# Patient Record
Sex: Male | Born: 1970 | Race: White | Hispanic: No | Marital: Married | Smoking: Current every day smoker
Health system: Southern US, Community
[De-identification: ages and names within clinical notes are randomized; demographics above are authoritative.]

## PROBLEM LIST (undated history)

## (undated) DIAGNOSIS — K7460 Unspecified cirrhosis of liver: Secondary | ICD-10-CM

## (undated) DIAGNOSIS — F4310 Post-traumatic stress disorder, unspecified: Secondary | ICD-10-CM

## (undated) DIAGNOSIS — F329 Major depressive disorder, single episode, unspecified: Secondary | ICD-10-CM

## (undated) DIAGNOSIS — F419 Anxiety disorder, unspecified: Secondary | ICD-10-CM

## (undated) DIAGNOSIS — F32A Depression, unspecified: Secondary | ICD-10-CM

## (undated) HISTORY — PX: KNEE SURGERY: SHX244

## (undated) HISTORY — DX: Unspecified cirrhosis of liver: K74.60

## (undated) HISTORY — DX: Post-traumatic stress disorder, unspecified: F43.10

---

## 1998-04-11 ENCOUNTER — Encounter: Payer: Self-pay | Admitting: Family Medicine

## 1998-04-11 ENCOUNTER — Ambulatory Visit (HOSPITAL_COMMUNITY): Admission: RE | Admit: 1998-04-11 | Discharge: 1998-04-11 | Payer: Self-pay | Admitting: Family Medicine

## 1998-04-11 ENCOUNTER — Emergency Department (HOSPITAL_COMMUNITY): Admission: EM | Admit: 1998-04-11 | Discharge: 1998-04-11 | Payer: Self-pay | Admitting: Emergency Medicine

## 2001-11-11 ENCOUNTER — Emergency Department (HOSPITAL_COMMUNITY): Admission: EM | Admit: 2001-11-11 | Discharge: 2001-11-11 | Payer: Self-pay | Admitting: Emergency Medicine

## 2001-11-11 ENCOUNTER — Encounter: Payer: Self-pay | Admitting: Emergency Medicine

## 2003-03-29 ENCOUNTER — Emergency Department (HOSPITAL_COMMUNITY): Admission: AD | Admit: 2003-03-29 | Discharge: 2003-03-29 | Payer: Self-pay | Admitting: Family Medicine

## 2006-05-27 ENCOUNTER — Encounter: Admission: RE | Admit: 2006-05-27 | Discharge: 2006-05-27 | Payer: Self-pay | Admitting: Occupational Medicine

## 2012-01-23 ENCOUNTER — Emergency Department (HOSPITAL_COMMUNITY): Payer: 59

## 2012-01-23 ENCOUNTER — Encounter: Payer: Self-pay | Admitting: Emergency Medicine

## 2012-01-23 ENCOUNTER — Ambulatory Visit: Payer: 59

## 2012-01-23 ENCOUNTER — Encounter (HOSPITAL_COMMUNITY): Payer: Self-pay | Admitting: *Deleted

## 2012-01-23 ENCOUNTER — Ambulatory Visit (INDEPENDENT_AMBULATORY_CARE_PROVIDER_SITE_OTHER): Payer: 59 | Admitting: Emergency Medicine

## 2012-01-23 ENCOUNTER — Emergency Department (HOSPITAL_COMMUNITY)
Admission: EM | Admit: 2012-01-23 | Discharge: 2012-01-23 | Disposition: A | Discharge status: Home / Self Care | Payer: 59 | Attending: Emergency Medicine | Admitting: Emergency Medicine

## 2012-01-23 VITALS — BP 116/77 | HR 89 | Temp 97.6°F | Resp 20 | Ht 74.0 in | Wt 177.0 lb

## 2012-01-23 DIAGNOSIS — R079 Chest pain, unspecified: Secondary | ICD-10-CM | POA: Insufficient documentation

## 2012-01-23 DIAGNOSIS — R05 Cough: Secondary | ICD-10-CM | POA: Insufficient documentation

## 2012-01-23 DIAGNOSIS — F17200 Nicotine dependence, unspecified, uncomplicated: Secondary | ICD-10-CM | POA: Insufficient documentation

## 2012-01-23 DIAGNOSIS — R9389 Abnormal findings on diagnostic imaging of other specified body structures: Secondary | ICD-10-CM

## 2012-01-23 DIAGNOSIS — R0602 Shortness of breath: Secondary | ICD-10-CM

## 2012-01-23 DIAGNOSIS — R059 Cough, unspecified: Secondary | ICD-10-CM | POA: Insufficient documentation

## 2012-01-23 HISTORY — DX: Depression, unspecified: F32.A

## 2012-01-23 HISTORY — DX: Major depressive disorder, single episode, unspecified: F32.9

## 2012-01-23 HISTORY — DX: Rider (driver) (passenger) of other motorcycle injured in unspecified traffic accident, initial encounter: V29.99XA

## 2012-01-23 LAB — POCT I-STAT TROPONIN I

## 2012-01-23 LAB — CBC WITH DIFFERENTIAL/PLATELET
Basophils Relative: 0 % (ref 0–1)
Eosinophils Relative: 2 % (ref 0–5)
HCT: 40.4 % (ref 39.0–52.0)
Hemoglobin: 14 g/dL (ref 13.0–17.0)
MCH: 31.5 pg (ref 26.0–34.0)
MCHC: 34.7 g/dL (ref 30.0–36.0)
MCV: 90.8 fL (ref 78.0–100.0)
Monocytes Absolute: 0.7 10*3/uL (ref 0.1–1.0)
Monocytes Relative: 8 % (ref 3–12)
Neutro Abs: 6.6 10*3/uL (ref 1.7–7.7)

## 2012-01-23 LAB — COMPREHENSIVE METABOLIC PANEL
Albumin: 3.6 g/dL (ref 3.5–5.2)
BUN: 11 mg/dL (ref 6–23)
Chloride: 104 mEq/L (ref 96–112)
Creatinine, Ser: 0.85 mg/dL (ref 0.50–1.35)
GFR calc non Af Amer: >90 mL/min (ref >90)
Total Bilirubin: 0.3 mg/dL (ref 0.3–1.2)

## 2012-01-23 LAB — LIPASE, BLOOD: Lipase: 28 U/L (ref 11–59)

## 2012-01-23 MED ORDER — TRAMADOL HCL 50 MG PO TABS: 50.0000 mg | ORAL_TABLET | Freq: Four times a day (QID) | ORAL | Status: DC | PRN

## 2012-01-23 MED ORDER — MORPHINE SULFATE 4 MG/ML IJ SOLN
4.0000 mg | Freq: Once | INTRAMUSCULAR | Status: AC
  Administered 2012-01-23: 4 mg via INTRAVENOUS
  Filled 2012-01-23: qty 1

## 2012-01-23 MED ORDER — ACETAMINOPHEN 325 MG PO TABS
650.0000 mg | ORAL_TABLET | Freq: Once | ORAL | Status: AC
  Administered 2012-01-23: 650 mg via ORAL
  Filled 2012-01-23: qty 1
  Filled 2012-01-23: qty 2

## 2012-01-23 MED ORDER — ONDANSETRON HCL 4 MG/2ML IJ SOLN
4.0000 mg | Freq: Once | INTRAMUSCULAR | Status: AC
  Administered 2012-01-23: 4 mg via INTRAVENOUS
  Filled 2012-01-23: qty 2

## 2012-01-23 NOTE — ED Provider Notes (Signed)
9:38 AM  Date: 01/23/2012  Rate: 62  Rhythm: normal sinus rhythm  QRS Axis: normal  Intervals: normal  ST/T Wave abnormalities: normal  Conduction Disutrbances:none  Narrative Interpretation: Normal EKG  Old EKG Reviewed: none available    Carleene Cooper III, MD 01/23/12 [phone]

## 2012-01-23 NOTE — Progress Notes (Signed)
  Subjective:    Patient ID: Lance Matthews, male    DOB: [birth year], 41 y.o.   MRN: [id number]  HPI issue managements onset Saturday of severe lower substernal chest pain. The pain is sharp in nature. It seems to come and go and is severe at times. He has significant pain when he tries to take a breath in. He denies previous trauma to his chest. The pain has come and gone since Saturday. He awakened this morning with severe pain associated with significant shortness of breath. He denies radiation of the pain. He has had no nausea or diaphoresis. He has no history of coronary disease. He is a history of the smoking no drug use..    Review of Systems significant for history of depression currently under treatment     Objective:   Physical Exam HEENT exam is unremarkable. His neck is supple. His chest is clear to auscultation and percussion. Cardiac exam did not reveal a rub on exam. Heart sounds are normal  [facility] reading (PRIMARY) by  Dr.Derrien Anschutz prominent markings in both lung bases and in the lingula. Heart size is small  EKG      Assessment     Patient presents with severe pleuritic chest pain with abn CXR with increased markings.

## 2012-01-23 NOTE — ED Notes (Signed)
Patient transported to X-ray 
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
## 2012-01-23 NOTE — ED Provider Notes (Signed)
Medical screening examination/treatment/procedure(s) were performed by non-physician practitioner and as supervising physician I was immediately available for consultation/collaboration.   Carleene Cooper III, MD 01/23/12 2028

## 2012-01-23 NOTE — ED Notes (Signed)
Pt has had epigastric pain which is sharp in nature and increases with deep inspiration since Saturday.  Pt was seen at [facility], EKG was WNL and pt sent to [facility] for further evaluation.  No n/v or diaphoresis with this.  No sob.  Pt has IV in LAC, pt had 324mg  aspirin and 3sl nitro pta.  Pain was 5/10 at its worst and is 3/10 on arrival.  Pt is alert and oriented on arrival.

## 2014-02-11 ENCOUNTER — Emergency Department (HOSPITAL_COMMUNITY): Payer: 59

## 2014-02-11 ENCOUNTER — Emergency Department (HOSPITAL_COMMUNITY)
Admission: EM | Admit: 2014-02-11 | Discharge: 2014-02-11 | Disposition: A | Discharge status: Home / Self Care | Payer: 59 | Attending: Emergency Medicine | Admitting: Emergency Medicine

## 2014-02-11 ENCOUNTER — Encounter (HOSPITAL_COMMUNITY): Payer: Self-pay | Admitting: Emergency Medicine

## 2014-02-11 DIAGNOSIS — R109 Unspecified abdominal pain: Secondary | ICD-10-CM | POA: Diagnosis present

## 2014-02-11 DIAGNOSIS — F329 Major depressive disorder, single episode, unspecified: Secondary | ICD-10-CM | POA: Insufficient documentation

## 2014-02-11 DIAGNOSIS — K5732 Diverticulitis of large intestine without perforation or abscess without bleeding: Secondary | ICD-10-CM

## 2014-02-11 DIAGNOSIS — Z792 Long term (current) use of antibiotics: Secondary | ICD-10-CM | POA: Diagnosis not present

## 2014-02-11 DIAGNOSIS — Z87828 Personal history of other (healed) physical injury and trauma: Secondary | ICD-10-CM | POA: Insufficient documentation

## 2014-02-11 DIAGNOSIS — Z79899 Other long term (current) drug therapy: Secondary | ICD-10-CM | POA: Insufficient documentation

## 2014-02-11 DIAGNOSIS — Z720 Tobacco use: Secondary | ICD-10-CM | POA: Diagnosis not present

## 2014-02-11 DIAGNOSIS — R1030 Lower abdominal pain, unspecified: Secondary | ICD-10-CM

## 2014-02-11 LAB — COMPREHENSIVE METABOLIC PANEL
ALBUMIN: 4 g/dL (ref 3.5–5.2)
ALK PHOS: 87 U/L (ref 39–117)
ALT: 17 U/L (ref 0–53)
ANION GAP: 12 (ref 5–15)
AST: 14 U/L (ref 0–37)
BUN: 12 mg/dL (ref 6–23)
CALCIUM: 9.1 mg/dL (ref 8.4–10.5)
CO2: 27 mEq/L (ref 19–32)
CREATININE: 0.96 mg/dL (ref 0.50–1.35)
Chloride: 98 mEq/L (ref 96–112)
GFR calc non Af Amer: >90 mL/min (ref >90)
GLUCOSE: 97 mg/dL (ref 70–99)
Potassium: 3.7 mEq/L (ref 3.7–5.3)
Sodium: 137 mEq/L (ref 137–147)
TOTAL PROTEIN: 7.1 g/dL (ref 6.0–8.3)
Total Bilirubin: 0.6 mg/dL (ref 0.3–1.2)

## 2014-02-11 LAB — CBC WITH DIFFERENTIAL/PLATELET
BASOS PCT: 0 % (ref 0–1)
Basophils Absolute: 0 10*3/uL (ref 0.0–0.1)
EOS ABS: 0.1 10*3/uL (ref 0.0–0.7)
EOS PCT: 0 % (ref 0–5)
HCT: 43.3 % (ref 39.0–52.0)
HEMOGLOBIN: 15.2 g/dL (ref 13.0–17.0)
LYMPHS ABS: 1.2 10*3/uL (ref 0.7–4.0)
Lymphocytes Relative: 8 % — ABNORMAL LOW (ref 12–46)
MCH: 31.9 pg (ref 26.0–34.0)
MCHC: 35.1 g/dL (ref 30.0–36.0)
MCV: 90.8 fL (ref 78.0–100.0)
MONO ABS: 0.9 10*3/uL (ref 0.1–1.0)
MONOS PCT: 6 % (ref 3–12)
NEUTROS PCT: 86 % — AB (ref 43–77)
Neutro Abs: 12.2 10*3/uL — ABNORMAL HIGH (ref 1.7–7.7)
Platelets: 156 10*3/uL (ref 150–400)
RBC: 4.77 MIL/uL (ref 4.22–5.81)
RDW: 13.1 % (ref 11.5–15.5)
WBC: 14.3 10*3/uL — ABNORMAL HIGH (ref 4.0–10.5)

## 2014-02-11 LAB — URINALYSIS, ROUTINE W REFLEX MICROSCOPIC
BILIRUBIN URINE: NEGATIVE
Glucose, UA: NEGATIVE mg/dL
Hgb urine dipstick: NEGATIVE
Ketones, ur: NEGATIVE mg/dL
LEUKOCYTES UA: NEGATIVE
NITRITE: NEGATIVE
PH: 7 (ref 5.0–8.0)
Protein, ur: NEGATIVE mg/dL
SPECIFIC GRAVITY, URINE: 1.014 (ref 1.005–1.030)
UROBILINOGEN UA: 1 mg/dL (ref 0.0–1.0)

## 2014-02-11 MED ORDER — METRONIDAZOLE 500 MG PO TABS: 500.0000 mg | ORAL_TABLET | Freq: Three times a day (TID) | ORAL | Status: DC

## 2014-02-11 MED ORDER — IOHEXOL 300 MG/ML  SOLN
100.0000 mL | Freq: Once | INTRAMUSCULAR | Status: AC | PRN
  Administered 2014-02-11: 100 mL via INTRAVENOUS

## 2014-02-11 MED ORDER — OXYCODONE-ACETAMINOPHEN 5-325 MG PO TABS: 1.0000 | ORAL_TABLET | ORAL | Status: DC | PRN

## 2014-02-11 MED ORDER — IOHEXOL 300 MG/ML  SOLN
50.0000 mL | Freq: Once | INTRAMUSCULAR | Status: AC | PRN
  Administered 2014-02-11: 50 mL via ORAL

## 2014-02-11 MED ORDER — METRONIDAZOLE IN NACL 5-0.79 MG/ML-% IV SOLN
500.0000 mg | Freq: Once | INTRAVENOUS | Status: AC
  Administered 2014-02-11: 500 mg via INTRAVENOUS
  Filled 2014-02-11: qty 100

## 2014-02-11 MED ORDER — CIPROFLOXACIN HCL 500 MG PO TABS: 500.0000 mg | ORAL_TABLET | Freq: Two times a day (BID) | ORAL | Status: DC

## 2014-02-11 MED ORDER — CIPROFLOXACIN IN D5W 400 MG/200ML IV SOLN
400.0000 mg | Freq: Once | INTRAVENOUS | Status: AC
  Administered 2014-02-11: 400 mg via INTRAVENOUS
  Filled 2014-02-11: qty 200

## 2014-02-11 MED ORDER — SODIUM CHLORIDE 0.9 % IV SOLN
Freq: Once | INTRAVENOUS | Status: AC
  Administered 2014-02-11: 16:00:00 via INTRAVENOUS

## 2014-02-11 MED ORDER — ONDANSETRON HCL 4 MG/2ML IJ SOLN
4.0000 mg | Freq: Once | INTRAMUSCULAR | Status: AC
  Administered 2014-02-11: 4 mg via INTRAVENOUS
  Filled 2014-02-11: qty 2

## 2014-02-11 MED ORDER — HYDROMORPHONE HCL 1 MG/ML IJ SOLN
0.5000 mg | Freq: Once | INTRAMUSCULAR | Status: AC
  Administered 2014-02-11: 0.5 mg via INTRAVENOUS
  Filled 2014-02-11: qty 1

## 2014-02-11 MED ORDER — ONDANSETRON 8 MG PO TBDP: ORAL_TABLET | ORAL | Status: DC

## 2014-02-11 MED ORDER — HYDROMORPHONE HCL 1 MG/ML IJ SOLN
1.0000 mg | INTRAMUSCULAR | Status: DC | PRN
  Administered 2014-02-11: 1 mg via INTRAVENOUS
  Filled 2014-02-11: qty 1

## 2014-02-11 MED ORDER — OXYCODONE-ACETAMINOPHEN 5-325 MG PO TABS
2.0000 | ORAL_TABLET | Freq: Once | ORAL | Status: AC
  Administered 2014-02-11: 2 via ORAL
  Filled 2014-02-11: qty 2

## 2014-02-11 NOTE — ED Provider Notes (Signed)
Medical screening examination/treatment/procedure(s) were performed by non-physician practitioner and as supervising physician I was immediately available for consultation/collaboration.   EKG Interpretation None        Ernestina Patches, MD 02/11/14 2112
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
## 2014-02-11 NOTE — ED Notes (Signed)
Stomach pain x 1 day. EDPA Jarrett Soho present to evaluate pt upon arrival to room

## 2014-02-11 NOTE — ED Notes (Signed)
Patient transported to CT 

## 2014-02-11 NOTE — Discharge Instructions (Signed)
1. Medications: Cipro, Flagyl, Percocet, Zofran, usual home medications 2. Treatment: rest, drink plenty of fluids, advance diet slowly 3. Follow Up: Please followup with your primary doctor in 2 days for discussion of your diagnoses and further evaluation after today's visit; if you do not have a primary care doctor use the resource guide provided to find one; Please return to the ER for persistent vomiting, high fevers or worsening symptoms     Diverticulitis Diverticulitis is inflammation or infection of small pouches in your colon that form when you have a condition called diverticulosis. The pouches in your colon are called diverticula. Your colon, or large intestine, is where water is absorbed and stool is formed. Complications of diverticulitis can include:  Bleeding.  Severe infection.  Severe pain.  Perforation of your colon.  Obstruction of your colon. CAUSES  Diverticulitis is caused by bacteria. Diverticulitis happens when stool becomes trapped in diverticula. This allows bacteria to grow in the diverticula, which can lead to inflammation and infection. RISK FACTORS People with diverticulosis are at risk for diverticulitis. Eating a diet that does not include enough fiber from fruits and vegetables may make diverticulitis more likely to develop. SYMPTOMS  Symptoms of diverticulitis may include:  Abdominal pain and tenderness. The pain is normally located on the left side of the abdomen, but may occur in other areas.  Fever and chills.  Bloating.  Cramping.  Nausea.  Vomiting.  Constipation.  Diarrhea.  Blood in your stool. DIAGNOSIS  Your health care provider will ask you about your medical history and do a physical exam. You may need to have tests done because many medical conditions can cause the same symptoms as diverticulitis. Tests may include:  Blood tests.  Urine tests.  Imaging tests of the abdomen, including X-rays and CT scans. When your  condition is under control, your health care provider may recommend that you have a colonoscopy. A colonoscopy can show how severe your diverticula are and whether something else is causing your symptoms. TREATMENT  Most cases of diverticulitis are mild and can be treated at home. Treatment may include:  Taking over-the-counter pain medicines.  Following a clear liquid diet.  Taking antibiotic medicines by mouth for 7-10 days. More severe cases may be treated at a hospital. Treatment may include:  Not eating or drinking.  Taking prescription pain medicine.  Receiving antibiotic medicines through an IV tube.  Receiving fluids and nutrition through an IV tube.  Surgery. HOME CARE INSTRUCTIONS   Follow your health care provider's instructions carefully.  Follow a full liquid diet or other diet as directed by your health care provider. After your symptoms improve, your health care provider may tell you to change your diet. He or she may recommend you eat a high-fiber diet. Fruits and vegetables are good sources of fiber. Fiber makes it easier to pass stool.  Take fiber supplements or probiotics as directed by your health care provider.  Only take medicines as directed by your health care provider.  Keep all your follow-up appointments. SEEK MEDICAL CARE IF:   Your pain does not improve.  You have a hard time eating food.  Your bowel movements do not return to normal. SEEK IMMEDIATE MEDICAL CARE IF:   Your pain becomes worse.  Your symptoms do not get better.  Your symptoms suddenly get worse.  You have a fever.  You have repeated vomiting.  You have bloody or black, tarry stools. MAKE SURE YOU:   Understand these instructions.  Will  watch your condition.  Will get help right away if you are not doing well or get worse. Document Released: 01/09/2005 Document Revised: 04/06/2013 Document Reviewed: 02/24/2013 [facility] Patient Information 2015 [place], [place]. This  information is not intended to replace advice given to you by your health care provider. Make sure you discuss any questions you have with your health care provider.

## 2015-04-05 ENCOUNTER — Ambulatory Visit (INDEPENDENT_AMBULATORY_CARE_PROVIDER_SITE_OTHER): Payer: 59 | Admitting: Family Medicine

## 2015-04-05 VITALS — BP 126/80 | HR 71 | Temp 97.5°F | Resp 18 | Ht 73.0 in | Wt 180.0 lb

## 2015-04-05 DIAGNOSIS — S39012A Strain of muscle, fascia and tendon of lower back, initial encounter: Secondary | ICD-10-CM

## 2015-04-05 MED ORDER — OXYCODONE-ACETAMINOPHEN 5-325 MG PO TABS: 1.0000 | ORAL_TABLET | Freq: Three times a day (TID) | ORAL | Status: DC | PRN

## 2015-04-05 MED ORDER — PREDNISONE 20 MG PO TABS: ORAL_TABLET | ORAL | Status: DC

## 2015-04-05 MED ORDER — CYCLOBENZAPRINE HCL 10 MG PO TABS: 10.0000 mg | ORAL_TABLET | Freq: Three times a day (TID) | ORAL | Status: DC | PRN

## 2015-04-05 NOTE — Patient Instructions (Signed)
AFTER YOU COMPLETE THE PREDNISONE, I recommend keeping ibuprofen on board - esp taking before and during work. Then when you come home, take a muscle relaxant followed by 15 minutes of heat followed by gentle stretching. Try to do this heat/strethcing regiment 2 - 3 times a day.  If you are still having pain in 4 to 6 wks, come back to clinic for further eval. RTC immed if symptoms worsen or you develop any other concerning symptoms such as increasing pain, pain traveling somewhere, numbness or weakness in the leg, changes in bowels/bladder, or fever/chills so we can repeat exam and determine if imaging or other treatment are normal.  Low Back Strain With Rehab A strain is an injury in which a tendon or muscle is torn. The muscles and tendons of the lower back are vulnerable to strains. However, these muscles and tendons are very strong and require a great force to be injured. Strains are classified into three categories. Grade 1 strains cause pain, but the tendon is not lengthened. Grade 2 strains include a lengthened ligament, due to the ligament being stretched or partially ruptured. With grade 2 strains there is still function, although the function may be decreased. Grade 3 strains involve a complete tear of the tendon or muscle, and function is usually impaired. SYMPTOMS   Pain in the lower back.  Pain that affects one side more than the other.  Pain that gets worse with movement and may be felt in the hip, buttocks, or back of the thigh.  Muscle spasms of the muscles in the back.  Swelling along the muscles of the back.  Loss of strength of the back muscles.  Crackling sound (crepitation) when the muscles are touched. CAUSES  Lower back strains occur when a force is placed on the muscles or tendons that is greater than they can handle. Common causes of injury include:  Prolonged overuse of the muscle-tendon units in the lower back, usually from incorrect posture.  A single violent  injury or force applied to the back. RISK INCREASES WITH:  Sports that involve twisting forces on the spine or a lot of bending at the waist (football, rugby, weightlifting, bowling, golf, tennis, speed skating, racquetball, swimming, running, gymnastics, diving).  Poor strength and flexibility.  Failure to warm up properly before activity.  Family history of lower back pain or disk disorders.  Previous back injury or surgery (especially fusion).  Poor posture with lifting, especially heavy objects.  Prolonged sitting, especially with poor posture. PREVENTION   Learn and use proper posture when sitting or lifting (maintain proper posture when sitting, lift using the knees and legs, not at the waist).  Warm up and stretch properly before activity.  Allow for adequate recovery between workouts.  Maintain physical fitness:  Strength, flexibility, and endurance.  Cardiovascular fitness. PROGNOSIS  If treated properly, lower back strains usually heal within 6 weeks. RELATED COMPLICATIONS   Recurring symptoms, resulting in a chronic problem.  Chronic inflammation, scarring, and partial muscle-tendon tear.  Delayed healing or resolution of symptoms.  Prolonged disability. TREATMENT  Treatment first involves the use of ice and medicine, to reduce pain and inflammation. The use of strengthening and stretching exercises may help reduce pain with activity. These exercises may be performed at home or with a therapist. Severe injuries may require referral to a therapist for further evaluation and treatment, such as ultrasound. Your caregiver may advise that you wear a back brace or corset, to help reduce pain and discomfort. Often,  prolonged bed rest results in greater harm then benefit. Corticosteroid injections may be recommended. However, these should be reserved for the most serious cases. It is important to avoid using your back when lifting objects. At night, sleep on your back on a  firm mattress with a pillow placed under your knees. If non-surgical treatment is unsuccessful, surgery may be needed.  MEDICATION   If pain medicine is needed, nonsteroidal anti-inflammatory medicines (aspirin and ibuprofen), or other minor pain relievers (acetaminophen), are often advised.  Do not take pain medicine for 7 days before surgery.  Prescription pain relievers may be given, if your caregiver thinks they are needed. Use only as directed and only as much as you need.  Ointments applied to the skin may be helpful.  Corticosteroid injections may be given by your caregiver. These injections should be reserved for the most serious cases, because they may only be given a certain number of times. HEAT AND COLD  Cold treatment (icing) should be applied for 10 to 15 minutes every 2 to 3 hours for inflammation and pain, and immediately after activity that aggravates your symptoms. Use ice packs or an ice massage.  Heat treatment may be used before performing stretching and strengthening activities prescribed by your caregiver, physical therapist, or athletic trainer. Use a heat pack or a warm water soak. SEEK MEDICAL CARE IF:   Symptoms get worse or do not improve in 2 to 4 weeks, despite treatment.  You develop numbness, weakness, or loss of bowel or bladder function.  New, unexplained symptoms develop. (Drugs used in treatment may produce side effects.) EXERCISES  RANGE OF MOTION (ROM) AND STRETCHING EXERCISES - Low Back Strain Most people with lower back pain will find that their symptoms get worse with excessive bending forward (flexion) or arching at the lower back (extension). The exercises which will help resolve your symptoms will focus on the opposite motion.  Your physician, physical therapist or athletic trainer will help you determine which exercises will be most helpful to resolve your lower back pain. Do not complete any exercises without first consulting with your  caregiver. Discontinue any exercises which make your symptoms worse until you speak to your caregiver.  If you have pain, numbness or tingling which travels down into your buttocks, leg or foot, the goal of the therapy is for these symptoms to move closer to your back and eventually resolve. Sometimes, these leg symptoms will get better, but your lower back pain may worsen. This is typically an indication of progress in your rehabilitation. Be very alert to any changes in your symptoms and the activities in which you participated in the 24 hours prior to the change. Sharing this information with your caregiver will allow him/her to most efficiently treat your condition.  These exercises may help you when beginning to rehabilitate your injury. Your symptoms may resolve with or without further involvement from your physician, physical therapist or athletic trainer. While completing these exercises, remember:  Restoring tissue flexibility helps normal motion to return to the joints. This allows healthier, less painful movement and activity.  An effective stretch should be held for at least 30 seconds.  A stretch should never be painful. You should only feel a gentle lengthening or release in the stretched tissue. FLEXION RANGE OF MOTION AND STRETCHING EXERCISES: STRETCH - Flexion, Single Knee to Chest   Lie on a firm bed or floor with both legs extended in front of you.  Keeping one leg in contact with the  floor, bring your opposite knee to your chest. Hold your leg in place by either grabbing behind your thigh or at your knee.  Pull until you feel a gentle stretch in your lower back. Hold __________ seconds.  Slowly release your grasp and repeat the exercise with the opposite side. Repeat __________ times. Complete this exercise __________ times per day.  STRETCH - Flexion, Double Knee to Chest   Lie on a firm bed or floor with both legs extended in front of you.  Keeping one leg in contact  with the floor, bring your opposite knee to your chest.  Tense your stomach muscles to support your back and then lift your other knee to your chest. Hold your legs in place by either grabbing behind your thighs or at your knees.  Pull both knees toward your chest until you feel a gentle stretch in your lower back. Hold __________ seconds.  Tense your stomach muscles and slowly return one leg at a time to the floor. Repeat __________ times. Complete this exercise __________ times per day.  STRETCH - Low Trunk Rotation  Lie on a firm bed or floor. Keeping your legs in front of you, bend your knees so they are both pointed toward the ceiling and your feet are flat on the floor.  Extend your arms out to the side. This will stabilize your upper body by keeping your shoulders in contact with the floor.  Gently and slowly drop both knees together to one side until you feel a gentle stretch in your lower back. Hold for __________ seconds.  Tense your stomach muscles to support your lower back as you bring your knees back to the starting position. Repeat the exercise to the other side. Repeat __________ times. Complete this exercise __________ times per day  EXTENSION RANGE OF MOTION AND FLEXIBILITY EXERCISES: STRETCH - Extension, Prone on Elbows   Lie on your stomach on the floor, a bed will be too soft. Place your palms about shoulder width apart and at the height of your head.  Place your elbows under your shoulders. If this is too painful, stack pillows under your chest.  Allow your body to relax so that your hips drop lower and make contact more completely with the floor.  Hold this position for __________ seconds.  Slowly return to lying flat on the floor. Repeat __________ times. Complete this exercise __________ times per day.  RANGE OF MOTION - Extension, Prone Press Ups  Lie on your stomach on the floor, a bed will be too soft. Place your palms about shoulder width apart and at the  height of your head.  Keeping your back as relaxed as possible, slowly straighten your elbows while keeping your hips on the floor. You may adjust the placement of your hands to maximize your comfort. As you gain motion, your hands will come more underneath your shoulders.  Hold this position __________ seconds.  Slowly return to lying flat on the floor. Repeat __________ times. Complete this exercise __________ times per day.  RANGE OF MOTION- Quadruped, Neutral Spine   Assume a hands and knees position on a firm surface. Keep your hands under your shoulders and your knees under your hips. You may place padding under your knees for comfort.  Drop your head and point your tail bone toward the ground below you. This will round out your lower back like an angry cat. Hold this position for __________ seconds.  Slowly lift your head and release your tail bone  so that your back sags into a large arch, like an old horse.  Hold this position for __________ seconds.  Repeat this until you feel limber in your lower back.  Now, find your "sweet spot." This will be the most comfortable position somewhere between the two previous positions. This is your neutral spine. Once you have found this position, tense your stomach muscles to support your lower back.  Hold this position for __________ seconds. Repeat __________ times. Complete this exercise __________ times per day.  STRENGTHENING EXERCISES - Low Back Strain These exercises may help you when beginning to rehabilitate your injury. These exercises should be done near your "sweet spot." This is the neutral, low-back arch, somewhere between fully rounded and fully arched, that is your least painful position. When performed in this safe range of motion, these exercises can be used for people who have either a flexion or extension based injury. These exercises may resolve your symptoms with or without further involvement from your physician, physical  therapist or athletic trainer. While completing these exercises, remember:   Muscles can gain both the endurance and the strength needed for everyday activities through controlled exercises.  Complete these exercises as instructed by your physician, physical therapist or athletic trainer. Increase the resistance and repetitions only as guided.  You may experience muscle soreness or fatigue, but the pain or discomfort you are trying to eliminate should never worsen during these exercises. If this pain does worsen, stop and make certain you are following the directions exactly. If the pain is still present after adjustments, discontinue the exercise until you can discuss the trouble with your caregiver. STRENGTHENING - Deep Abdominals, Pelvic Tilt  Lie on a firm bed or floor. Keeping your legs in front of you, bend your knees so they are both pointed toward the ceiling and your feet are flat on the floor.  Tense your lower abdominal muscles to press your lower back into the floor. This motion will rotate your pelvis so that your tail bone is scooping upwards rather than pointing at your feet or into the floor.  With a gentle tension and even breathing, hold this position for __________ seconds. Repeat __________ times. Complete this exercise __________ times per day.  STRENGTHENING - Abdominals, Crunches   Lie on a firm bed or floor. Keeping your legs in front of you, bend your knees so they are both pointed toward the ceiling and your feet are flat on the floor. Cross your arms over your chest.  Slightly tip your chin down without bending your neck.  Tense your abdominals and slowly lift your trunk high enough to just clear your shoulder blades. Lifting higher can put excessive stress on the lower back and does not further strengthen your abdominal muscles.  Control your return to the starting position. Repeat __________ times. Complete this exercise __________ times per day.  STRENGTHENING -  Quadruped, Opposite UE/LE Lift   Assume a hands and knees position on a firm surface. Keep your hands under your shoulders and your knees under your hips. You may place padding under your knees for comfort.  Find your neutral spine and gently tense your abdominal muscles so that you can maintain this position. Your shoulders and hips should form a rectangle that is parallel with the floor and is not twisted.  Keeping your trunk steady, lift your right hand no higher than your shoulder and then your left leg no higher than your hip. Make sure you are not holding your  breath. Hold this position __________ seconds.  Continuing to keep your abdominal muscles tense and your back steady, slowly return to your starting position. Repeat with the opposite arm and leg. Repeat __________ times. Complete this exercise __________ times per day.  STRENGTHENING - Lower Abdominals, Double Knee Lift  Lie on a firm bed or floor. Keeping your legs in front of you, bend your knees so they are both pointed toward the ceiling and your feet are flat on the floor.  Tense your abdominal muscles to brace your lower back and slowly lift both of your knees until they come over your hips. Be certain not to hold your breath.  Hold __________ seconds. Using your abdominal muscles, return to the starting position in a slow and controlled manner. Repeat __________ times. Complete this exercise __________ times per day.  POSTURE AND BODY MECHANICS CONSIDERATIONS - Low Back Strain Keeping correct posture when sitting, standing or completing your activities will reduce the stress put on different body tissues, allowing injured tissues a chance to heal and limiting painful experiences. The following are general guidelines for improved posture. Your physician or physical therapist will provide you with any instructions specific to your needs. While reading these guidelines, remember:  The exercises prescribed by your provider will  help you have the flexibility and strength to maintain correct postures.  The correct posture provides the best environment for your joints to work. All of your joints have less wear and tear when properly supported by a spine with good posture. This means you will experience a healthier, less painful body.  Correct posture must be practiced with all of your activities, especially prolonged sitting and standing. Correct posture is as important when doing repetitive low-stress activities (typing) as it is when doing a single heavy-load activity (lifting). RESTING POSITIONS Consider which positions are most painful for you when choosing a resting position. If you have pain with flexion-based activities (sitting, bending, stooping, squatting), choose a position that allows you to rest in a less flexed posture. You would want to avoid curling into a fetal position on your side. If your pain worsens with extension-based activities (prolonged standing, working overhead), avoid resting in an extended position such as sleeping on your stomach. Most people will find more comfort when they rest with their spine in a more neutral position, neither too rounded nor too arched. Lying on a non-sagging bed on your side with a pillow between your knees, or on your back with a pillow under your knees will often provide some relief. Keep in mind, being in any one position for a prolonged period of time, no matter how correct your posture, can still lead to stiffness. PROPER SITTING POSTURE In order to minimize stress and discomfort on your spine, you must sit with correct posture. Sitting with good posture should be effortless for a healthy body. Returning to good posture is a gradual process. Many people can work toward this most comfortably by using various supports until they have the flexibility and strength to maintain this posture on their own. When sitting with proper posture, your ears will fall over your shoulders  and your shoulders will fall over your hips. You should use the back of the chair to support your upper back. Your lower back will be in a neutral position, just slightly arched. You may place a small pillow or folded towel at the base of your lower back for support.  When working at a desk, create an environment that supports good,  upright posture. Without extra support, muscles tire, which leads to excessive strain on joints and other tissues. Keep these recommendations in mind: CHAIR:  A chair should be able to slide under your desk when your back makes contact with the back of the chair. This allows you to work closely.  The chair's height should allow your eyes to be level with the upper part of your monitor and your hands to be slightly lower than your elbows. BODY POSITION  Your feet should make contact with the floor. If this is not possible, use a foot rest.  Keep your ears over your shoulders. This will reduce stress on your neck and lower back. INCORRECT SITTING POSTURES  If you are feeling tired and unable to assume a healthy sitting posture, do not slouch or slump. This puts excessive strain on your back tissues, causing more damage and pain. Healthier options include:  Using more support, like a lumbar pillow.  Switching tasks to something that requires you to be upright or walking.  Talking a brief walk.  Lying down to rest in a neutral-spine position. PROLONGED STANDING WHILE SLIGHTLY LEANING FORWARD  When completing a task that requires you to lean forward while standing in one place for a long time, place either foot up on a stationary 2-4 inch high object to help maintain the best posture. When both feet are on the ground, the lower back tends to lose its slight inward curve. If this curve flattens (or becomes too large), then the back and your other joints will experience too much stress, tire more quickly, and can cause pain. CORRECT STANDING POSTURES Proper standing  posture should be assumed with all daily activities, even if they only take a few moments, like when brushing your teeth. As in sitting, your ears should fall over your shoulders and your shoulders should fall over your hips. You should keep a slight tension in your abdominal muscles to brace your spine. Your tailbone should point down to the ground, not behind your body, resulting in an over-extended swayback posture.  INCORRECT STANDING POSTURES  Common incorrect standing postures include a forward head, locked knees and/or an excessive swayback. WALKING Walk with an upright posture. Your ears, shoulders and hips should all line-up. PROLONGED ACTIVITY IN A FLEXED POSITION When completing a task that requires you to bend forward at your waist or lean over a low surface, try to find a way to stabilize 3 out of 4 of your limbs. You can place a hand or elbow on your thigh or rest a knee on the surface you are reaching across. This will provide you more stability so that your muscles do not fatigue as quickly. By keeping your knees relaxed, or slightly bent, you will also reduce stress across your lower back. CORRECT LIFTING TECHNIQUES DO :   Assume a wide stance. This will provide you more stability and the opportunity to get as close as possible to the object which you are lifting.  Tense your abdominals to brace your spine. Bend at the knees and hips. Keeping your back locked in a neutral-spine position, lift using your leg muscles. Lift with your legs, keeping your back straight.  Test the weight of unknown objects before attempting to lift them.  Try to keep your elbows locked down at your sides in order get the best strength from your shoulders when carrying an object.  Always ask for help when lifting heavy or awkward objects. INCORRECT LIFTING TECHNIQUES DO NOT:  Lock your knees when lifting, even if it is a small object.  Bend and twist. Pivot at your feet or move your feet when  needing to change directions.  Assume that you can safely pick up even a paper clip without proper posture.   This information is not intended to replace advice given to you by your health care provider. Make sure you discuss any questions you have with your health care provider.   Document Released: 04/01/2005 Document Revised: 04/22/2014 Document Reviewed: 07/14/2008 [employer] Interactive Patient Education [employer].
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
## 2015-04-24 ENCOUNTER — Emergency Department (HOSPITAL_COMMUNITY)
Admission: EM | Admit: 2015-04-24 | Discharge: 2015-04-24 | Disposition: A | Discharge status: Home / Self Care | Payer: BLUE CROSS/BLUE SHIELD | Attending: Emergency Medicine | Admitting: Emergency Medicine

## 2015-04-24 ENCOUNTER — Emergency Department (HOSPITAL_COMMUNITY): Payer: BLUE CROSS/BLUE SHIELD

## 2015-04-24 ENCOUNTER — Encounter (HOSPITAL_COMMUNITY): Payer: Self-pay

## 2015-04-24 DIAGNOSIS — Z79899 Other long term (current) drug therapy: Secondary | ICD-10-CM | POA: Diagnosis not present

## 2015-04-24 DIAGNOSIS — R079 Chest pain, unspecified: Secondary | ICD-10-CM | POA: Diagnosis present

## 2015-04-24 DIAGNOSIS — R0602 Shortness of breath: Secondary | ICD-10-CM | POA: Diagnosis not present

## 2015-04-24 DIAGNOSIS — Z87828 Personal history of other (healed) physical injury and trauma: Secondary | ICD-10-CM | POA: Insufficient documentation

## 2015-04-24 DIAGNOSIS — F329 Major depressive disorder, single episode, unspecified: Secondary | ICD-10-CM | POA: Insufficient documentation

## 2015-04-24 DIAGNOSIS — F17210 Nicotine dependence, cigarettes, uncomplicated: Secondary | ICD-10-CM | POA: Diagnosis not present

## 2015-04-24 DIAGNOSIS — R0781 Pleurodynia: Secondary | ICD-10-CM

## 2015-04-24 LAB — CBC
HCT: 43.1 % (ref 39.0–52.0)
Hemoglobin: 14.6 g/dL (ref 13.0–17.0)
MCH: 33.2 pg (ref 26.0–34.0)
MCHC: 33.9 g/dL (ref 30.0–36.0)
MCV: 98 fL (ref 78.0–100.0)
Platelets: 176 10*3/uL (ref 150–400)
RBC: 4.4 MIL/uL (ref 4.22–5.81)
RDW: 13.1 % (ref 11.5–15.5)
WBC: 9.5 10*3/uL (ref 4.0–10.5)

## 2015-04-24 LAB — D-DIMER, QUANTITATIVE: D-Dimer, Quant: <0.27 ug/mL-FEU (ref 0.00–0.50)

## 2015-04-24 LAB — BASIC METABOLIC PANEL
Anion gap: 7 (ref 5–15)
BUN: 12 mg/dL (ref 6–20)
CALCIUM: 9.2 mg/dL (ref 8.9–10.3)
CHLORIDE: 103 mmol/L (ref 101–111)
CO2: 29 mmol/L (ref 22–32)
CREATININE: 0.89 mg/dL (ref 0.61–1.24)
GFR calc Af Amer: >60 mL/min (ref >60)
GFR calc non Af Amer: >60 mL/min (ref >60)
Glucose, Bld: 103 mg/dL — ABNORMAL HIGH (ref 65–99)
Potassium: 3.9 mmol/L (ref 3.5–5.1)
SODIUM: 139 mmol/L (ref 135–145)

## 2015-04-24 LAB — I-STAT TROPONIN, ED: Troponin i, poc: 0 ng/mL (ref 0.00–0.08)

## 2015-04-24 MED ORDER — INDOMETHACIN 50 MG PO CAPS: 50.0000 mg | ORAL_CAPSULE | Freq: Three times a day (TID) | ORAL | Status: DC

## 2015-04-24 MED ORDER — ALBUTEROL SULFATE (2.5 MG/3ML) 0.083% IN NEBU
5.0000 mg | INHALATION_SOLUTION | Freq: Once | RESPIRATORY_TRACT | Status: AC
  Administered 2015-04-24: 5 mg via RESPIRATORY_TRACT
  Filled 2015-04-24: qty 6

## 2015-04-24 NOTE — Discharge Instructions (Signed)
Pericarditis °Pericarditis is swelling (inflammation) of the pericardium. The pericardium is a thin, double-layered, fluid-filled tissue sac that surrounds the heart. The purpose of the pericardium is to contain the heart in the chest cavity and keep the heart from overexpanding. Different types of pericarditis can occur, such as: °· Acute pericarditis. Inflammation can develop suddenly in acute pericarditis. °· Chronic pericarditis. Inflammation develops gradually and is long-lasting in chronic pericarditis. °· Constrictive pericarditis. In this type of pericarditis, the layers of the pericardium stiffen and develop scar tissue. The scar tissue thickens and sticks together. This makes it difficult for the heart to pump and work as it normally does. °CAUSES  °Pericarditis can be caused from different conditions, such as: °· A bacterial, fungal or viral infection. °· After a heart attack (myocardial infarction). °· After open-heart surgery (coronary bypass graft surgery). °· Auto-immune conditions such as lupus, rheumatoid arthritis or scleroderma. °· Kidney failure. °· Low thyroid condition (hypothyroidism). °· Cancer from another part of the body that has spread (metastasized) to the pericardium. °· Chest injury or trauma. °· After radiation treatment. °· Certain medicines. °SYMPTOMS  °Symptoms of pericarditis can include: °· Chest pain. Chest pain symptoms may increase when laying down and may be relieved when sitting up and leaning forward. °· A chronic, dry cough. °· Heart palpitations. These may feel like rapid, fluttering or pounding heart beats. °· Chest pain may be worse when swallowing. °· Dizziness or fainting. °· Tiredness, fatigue or lethargy. °· Fever. °DIAGNOSIS  °Pericarditis is diagnosed by the following: °· A physical exam. A heart sound called a pericardial friction rub may be heard when your caregiver listens to your heart. °· Blood work. Blood may be drawn to check for an infection and to look at  your blood chemistry. °· Electrocardiography. During electrocardiography your heart's electrical activity is monitored and recorded with a tracing on paper (electrocardiogram [ECG]). °· Echocardiography. °· Computed tomography (CT). °· Magnetic resonance image (MRI). °TREATMENT  °To treat pericarditis, it is important to know the cause of it. The cause of pericarditis determines the treatment.  °· If the cause of pericarditis is due to an infection, treatment is based on the type of infection. If an infection is suspected in the pericardial fluid, a procedure called a pericardial fluid culture and biopsy may be done. This takes a sample of the pericardial fluid. The sample is sent to a lab which runs tests on the pericardial fluid to check for an infection. °· If the autoimmune disease is the cause, treatment of the autoimmune condition will help improve the pericarditis. °· If the cause of pericarditis is not known, anti-inflammatory medicines may be used to help decrease the inflammation. °· Surgery may be needed. The following are types of surgeries or procedures that may be done to treat pericarditis: °¨ Pericardial window. A pericardial window makes a cut (incision) into the pericardial sac. This allows excess fluid in the pericardium to drain. °¨ Pericardiocentesis. A pericardiocentesis is also known as a pericardial tap. This procedure uses a needle that is guided by X-ray to drain (aspirate) excess fluid from the pericardium. °¨ Pericardiectomy. A pericardiectomy removes part or all of the pericardium. °HOME CARE INSTRUCTIONS  °· Do not smoke. If you smoke, quit. Your caregiver can help you quit smoking. °· Maintain a healthy weight. °· Follow an exercise program as directed by your health care provider. You may need to limit your exercising until your symptoms go away. °· If you drink alcohol, do so in moderation. °·   Eat a heart healthy diet. A registered dietitian can help you learn about healthy food  choices. °· Keep a list of all your medicines with you at all times. Include the name, dose, how often it is taken and how it is taken. °SEEK IMMEDIATE MEDICAL CARE IF:  °· You have chest pain or feelings of chest pressure. °· You have sweating (diaphoresis) when at rest. °· You have irregular heartbeats (palpitations). °· You have rapid, racing heart beats. °· You have unexplained fainting episodes. °· You feel sick to your stomach (nausea) or vomiting without cause. °· You have unexplained weakness. °If you develop any of the symptoms which originally made you seek care, call for local emergency medical help. Do not drive yourself to the hospital. °  °This information is not intended to replace advice given to you by your health care provider. Make sure you discuss any questions you have with your health care provider. °  °Document Released: 09/25/2000 Document Revised: 08/16/2014 Document Reviewed: 10/12/2014 °Elsevier Interactive Patient Education ©2016 [employer] Inc[employer] ° °

## 2015-04-24 NOTE — ED Notes (Signed)
Pt c/o central chest/epigastric pain and SOB starting last night and intermittent L medial thigh pain starting today.  Pain score 8/10.  Sts "I feel like I've been punched in the chest."  Pt reports that he can not take a deep breath.  Denies recent illness.  Denies injury to leg.  Denies Hx of DVT.

## 2015-04-24 NOTE — ED Notes (Signed)
Patient was alert, oriented and stable upon discharge. RN went over AVS and patient had no further questions.  
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
## 2015-11-13 IMAGING — CT CT ABD-PELV W/ CM
1 of 2 series · 15 of 32 positions shown, 19 images · IV contrast (OMNIPAQUE 300)
Comparison: None.

CLINICAL DATA: Left lower quadrant pain

EXAM:
CT ABDOMEN AND PELVIS WITH CONTRAST
TECHNIQUE: Multidetector CT imaging of the abdomen and pelvis was performed
using the standard protocol following bolus administration of
intravenous contrast.
CONTRAST:  100mL OMNIPAQUE IOHEXOL 300 MG/ML  SOLN

[Series 2: abd/pel with · axial · 0.80mm/px · z∈[-285,+130]mm · 15 of 91 slices shown, 19 images]
[im 4/91  soft-tissue]
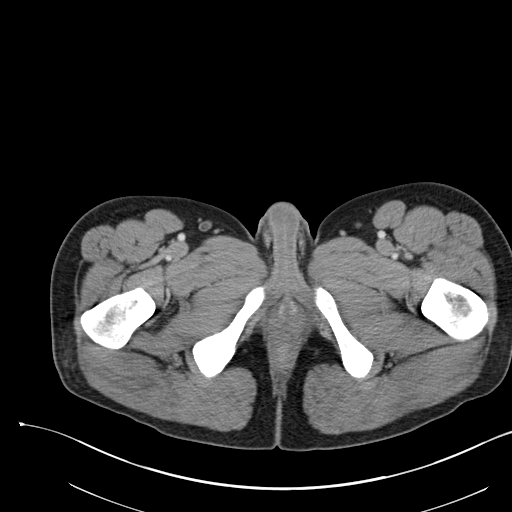
[im 4/91  bone]
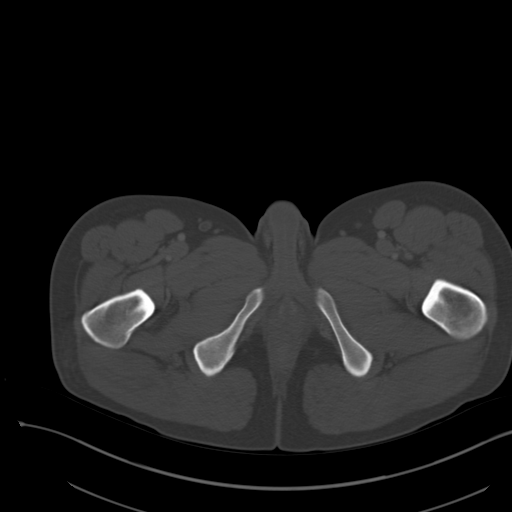
[im 11/91  soft-tissue]
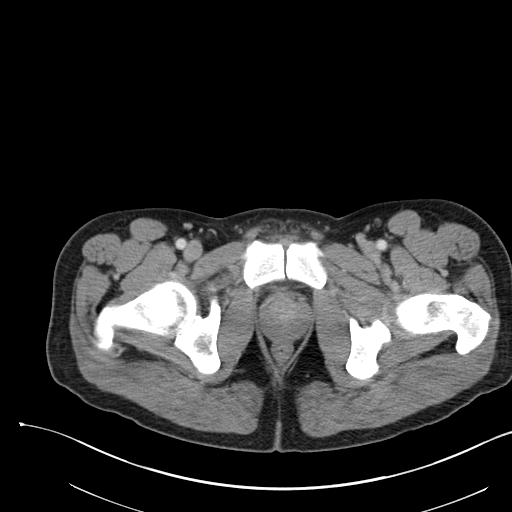
[im 19/91  soft-tissue]
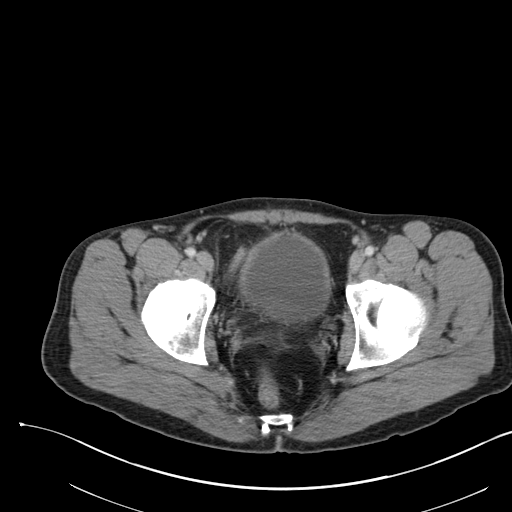
[im 26/91  soft-tissue]
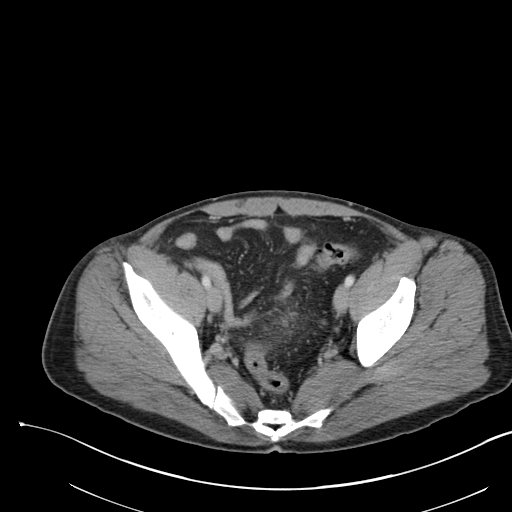
[im 33/91  soft-tissue]
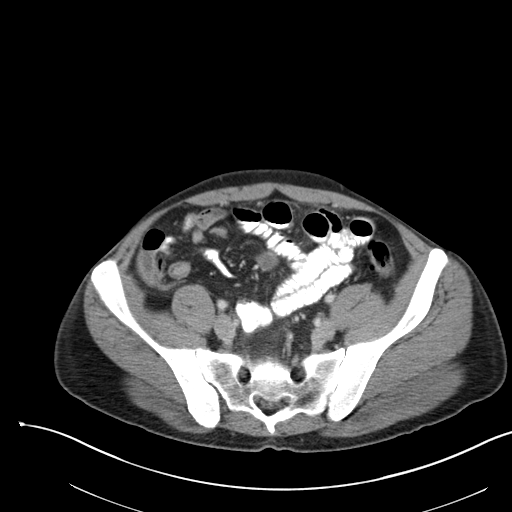
[im 40/91  soft-tissue]
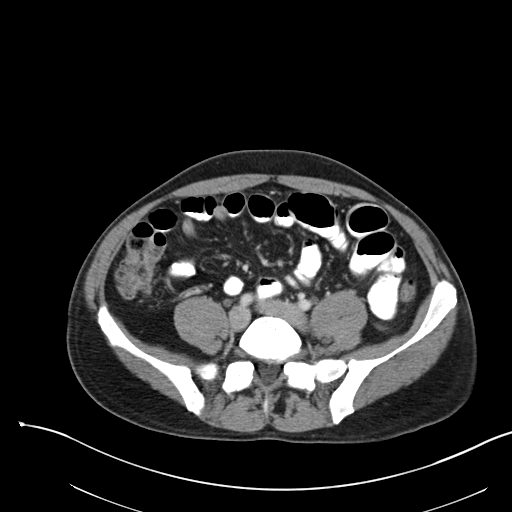
[im 47/91  soft-tissue]
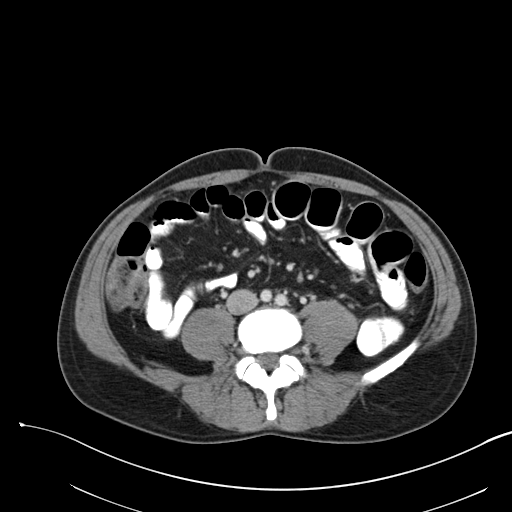
[im 51/91  soft-tissue]
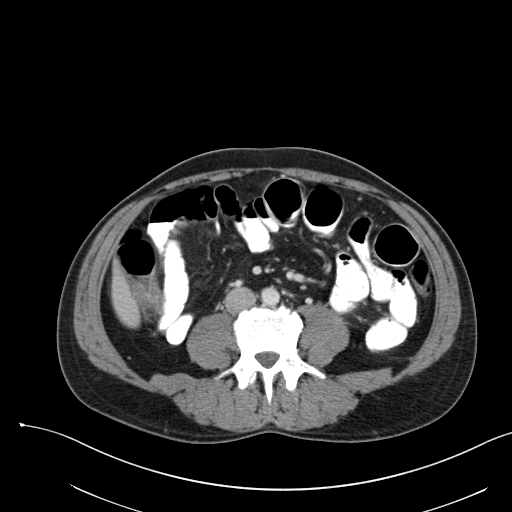
[im 58/91  soft-tissue]
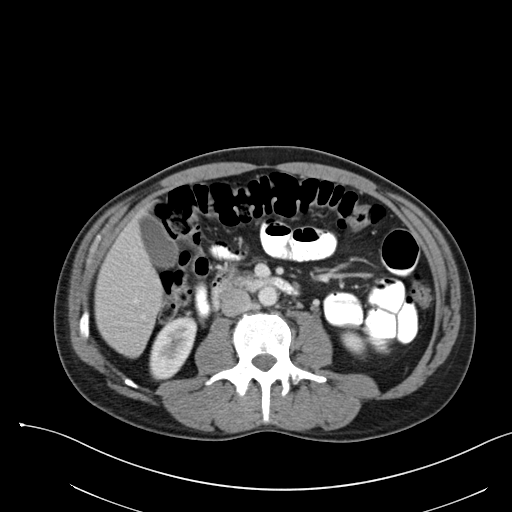
[im 58/91  bone]
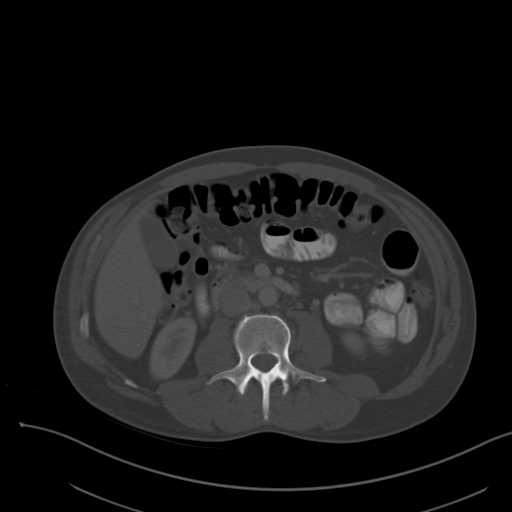
[im 65/91  soft-tissue]
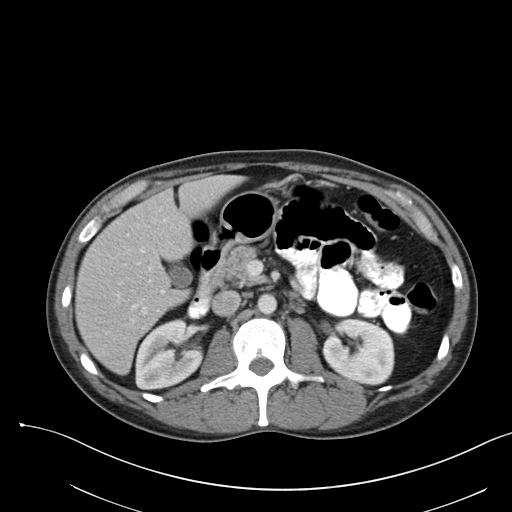
[im 73/91  soft-tissue]
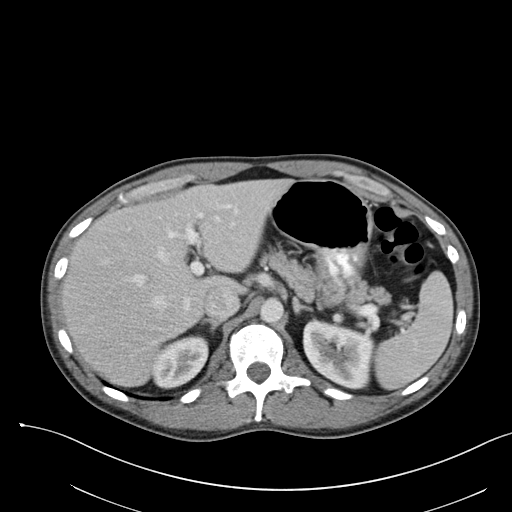
[im 76/91  lung]
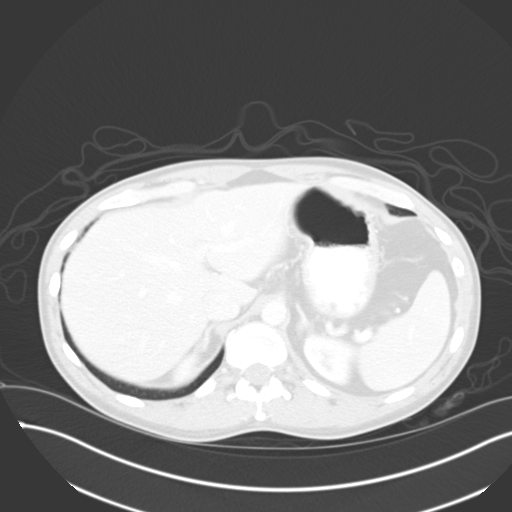
[im 80/91  soft-tissue]
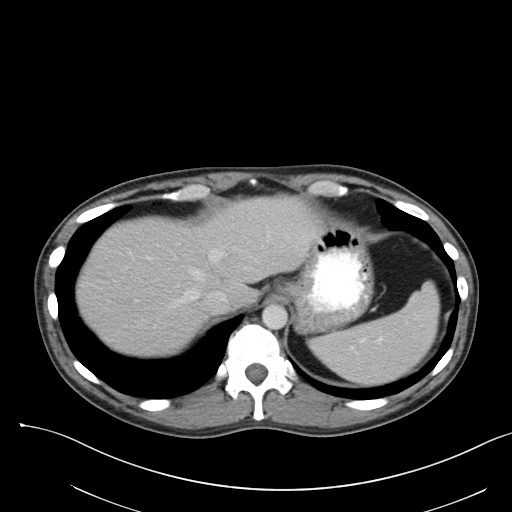
[im 80/91  lung]
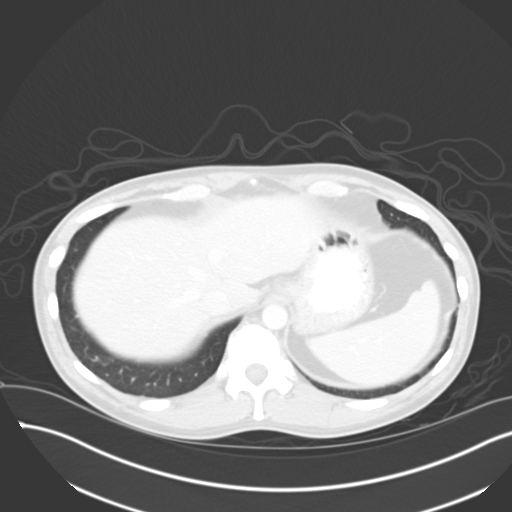
[im 83/91  lung]
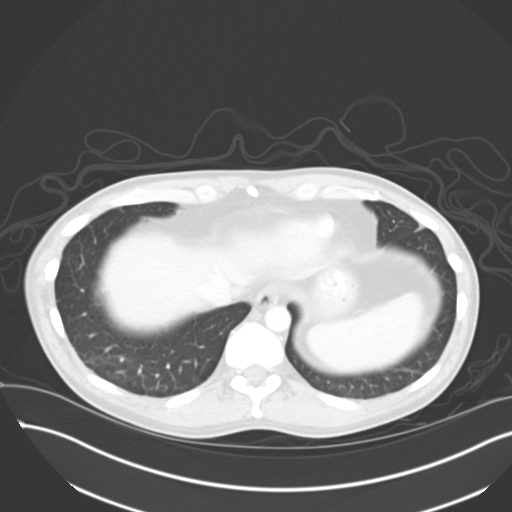
[im 87/91  soft-tissue]
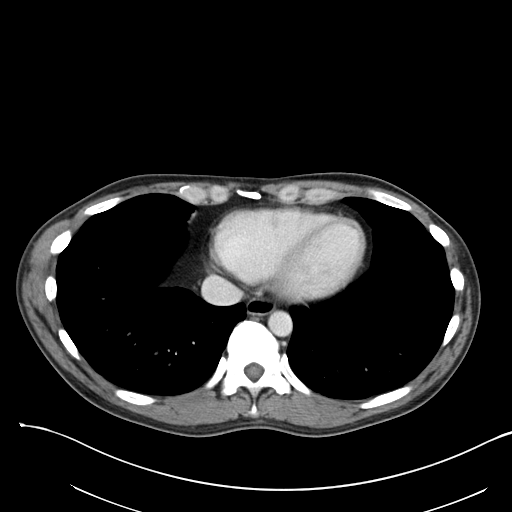
[im 87/91  lung]
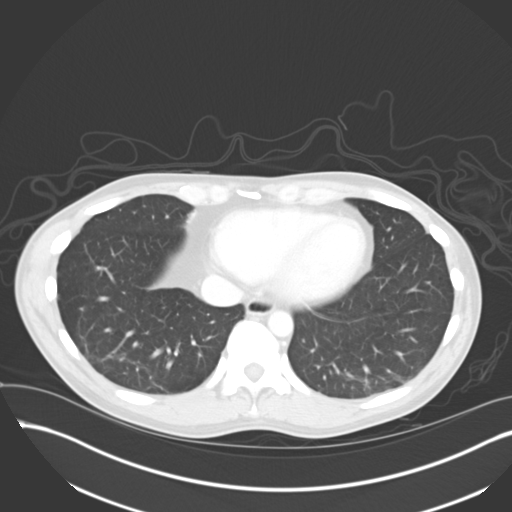

[15 of 32 positions shown; findings below may reference images not displayed]

FINDINGS: There is wall thickening and inflammatory change involving the
sigmoid colon associated with diverticulosis. Several round gas
bubbles beyond the central lumen of the sigmoid colon are all felt
to represent inflamed diverticuli. There is no definitive evidence
of extraluminal bowel gas to suggest perforation. There is
significant wall thickening measuring up to 1 cm. There is no free
intraperitoneal gas. There is no free-fluid. There is no abscess.

Liver, gallbladder, spleen, pancreas, adrenal glands, and kidneys
are within normal limits

Normal appendix.

Bladder and prostate are unremarkable.

Nor destructive bone lesion or vertebral compression deformity.
IMPRESSION: Acute sigmoid diverticulitis. No evidence of perforation or abscess.

## 2018-01-24 ENCOUNTER — Other Ambulatory Visit: Payer: Self-pay | Admitting: Psychiatry

## 2018-05-01 ENCOUNTER — Encounter: Payer: Self-pay | Admitting: Emergency Medicine

## 2018-05-01 DIAGNOSIS — F4312 Post-traumatic stress disorder, chronic: Secondary | ICD-10-CM

## 2018-05-01 DIAGNOSIS — F902 Attention-deficit hyperactivity disorder, combined type: Secondary | ICD-10-CM

## 2018-06-18 ENCOUNTER — Encounter: Payer: Self-pay | Admitting: Psychiatry

## 2018-06-18 ENCOUNTER — Ambulatory Visit: Payer: BLUE CROSS/BLUE SHIELD | Admitting: Psychiatry

## 2018-06-18 VITALS — BP 124/80 | HR 82 | Ht 73.0 in | Wt 198.0 lb

## 2018-06-18 DIAGNOSIS — F902 Attention-deficit hyperactivity disorder, combined type: Secondary | ICD-10-CM | POA: Diagnosis not present

## 2018-06-18 DIAGNOSIS — F4312 Post-traumatic stress disorder, chronic: Secondary | ICD-10-CM | POA: Diagnosis not present

## 2018-06-18 DIAGNOSIS — F341 Dysthymic disorder: Secondary | ICD-10-CM | POA: Insufficient documentation

## 2018-06-18 MED ORDER — RISPERIDONE 0.5 MG PO TABS: 0.5000 mg | ORAL_TABLET | Freq: Every day | ORAL | 5 refills | Status: DC

## 2018-06-18 MED ORDER — GABAPENTIN 600 MG PO TABS: 1200.0000 mg | ORAL_TABLET | Freq: Every day | ORAL | 5 refills | Status: DC

## 2018-06-18 MED ORDER — ZOLPIDEM TARTRATE 10 MG PO TABS: 15.0000 mg | ORAL_TABLET | Freq: Every day | ORAL | 5 refills | Status: DC

## 2018-06-18 MED ORDER — PAROXETINE HCL 20 MG PO TABS: 20.0000 mg | ORAL_TABLET | Freq: Every day | ORAL | 5 refills | Status: DC

## 2018-06-18 NOTE — Progress Notes (Signed)
[facility]  Patient ID: Lance Matthews,  MRN: [id number]  PCP: Default, Provider, MD  Date of Evaluation: 06/18/2018 Time spent:10 minutes  Chief Complaint:  Chief Complaint    Depression; Anxiety; ADHD; Trauma      HISTORY/CURRENT STATUS: Lance Matthews is seen individually face-to-face with consent not collateral for psychiatric interview and exam in 6 month brief primarily medication management appointment after 14 years of care in which patient requests and requires no change in his medication after a near hospitalization for PTSD symptoms resulting in this medication structure from Dr. Sabra Matthews at [facility] access and intake crisis.  Patient's father Lance Matthews [phone] participated briefly in his care.  Patient declines to stop low-dose Risperdal despite other medications and consistently doing well for years now.  No adverse effects session invariably having any insurance and jobs now to [employer] with youngest daughter newly learning to drive.   Individual Medical History/ Review of Systems: Changes? :No   EKG Interpretation Date/Time:  Monday April 24 2015 18:10:48 EST Ventricular Rate:  92 PR Interval:  141 QRS Duration: 86 QT Interval:  343 QTC Calculation: 424 R Axis:   79 Text Interpretation:  Sinus rhythm ST elevation suggests acute  pericarditis Confirmed by YELVERTON  MD, Lance ([id number]) on 04/24/2015  11:01:49 PM  EKG Interpretation  BASIC METABOLIC PANEL - Abnormal; Notable for the following:    Glucose, Bld 103 (*)    All other components within normal limits    Allergies: Patient has no known allergies.  Current Medications:  Current Outpatient Medications:  .  cyclobenzaprine (FLEXERIL) 10 MG tablet, Take 1 tablet (10 mg total) by mouth 3 (three) times daily as needed for muscle spasms., Disp: 30 tablet, Rfl: 1 .  diphenhydrAMINE (SOMINEX) 25 MG tablet, Take 50 mg by mouth at bedtime as needed for sleep (congestion).  Reported on 04/05/2015, Disp: , Rfl:  .  gabapentin (NEURONTIN) 300 MG capsule, Take 300 mg by mouth at bedtime. , Disp: , Rfl:  .  ibuprofen (ADVIL,MOTRIN) 200 MG tablet, Take 200-600 mg by mouth every 6 (six) hours as needed for headache or moderate pain., Disp: , Rfl:  .  indomethacin (INDOCIN) 50 MG capsule, Take 1 capsule (50 mg total) by mouth 3 (three) times daily with meals., Disp: 21 capsule, Rfl: 0 .  oxyCODONE-acetaminophen (ROXICET) 5-325 MG tablet, Take 1 tablet by mouth every 8 (eight) hours as needed for severe pain., Disp: 20 tablet, Rfl: 0 .  PARoxetine (PAXIL) 20 MG tablet, Take 20 mg by mouth daily., Disp: , Rfl:  .  predniSONE (DELTASONE) 20 MG tablet, 3 tabs po qd x2d, 2 tabs po qd x 2d, 1 tabs po qd x2d (Patient not taking: Reported on 04/24/2015), Disp: 12 tablet, Rfl: 0 .  risperiDONE (RISPERDAL) 0.5 MG tablet, Take 0.5 mg by mouth at bedtime., Disp: , Rfl:  .  zolpidem (AMBIEN) 10 MG tablet, Take 10 mg by mouth at bedtime as needed for sleep (sleep). For sleep, Disp: , Rfl:    Medication Side Effects: none  Family Medical/ Social History: Changes? No  MENTAL HEALTH EXAM: Muscle strengths and tone 5/5, postural reflexes and gait 0/0, and AIMS = 0. Blood pressure 124/80, pulse 82, height 6\' 1"  (1.854 m), weight 198 lb (89.8 kg).Body mass index is 26.12 kg/m.  General Appearance: Casual and Fairly Groomed  Eye Contact:  Good  Speech:  Clear and Coherent and Normal Rate  Volume:  Normal  Mood:  Anxious and Worthless  euthymic  Affect:  Labile, Full Range and Anxious  Thought Process:  Goal Directed  Orientation:  Full (Time, Place, and Person)  Thought Content: Rumination   Suicidal Thoughts:  No  Homicidal Thoughts:  No  Memory:  Immediate;   Good Remote;   Good  Judgement:  Good  Insight:  Fair  Psychomotor Activity:  Normal  Concentration:  Concentration: Good and Attention Span: Fair  Recall:  [place] of Knowledge: Good  Language: Good  Assets:  Desire  for Improvement Resilience Talents/Skills  ADL's:  Intact  Cognition: WNL  Prognosis:  Good    DIAGNOSES:    ICD-10-CM   1. Post-traumatic stress disorder, chronic F43.12   2. 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  He is E scribed to continue Neurontin 600 mg taking 2 nightly and Ambien 10 mg taking 1-1/2 tablets total 15 mg nightly as a month supply and 5 refills to Lance Matthews on [facility] for PTSD.  He is E scribed Risperdal 0.5 mg nightly and Paxil 20 mg nightly as a month supply and 5 refills each for PTSD and partially remitted dysthymia requiring no additional medication for ADHD sent to Lance Matthews, to return in 6 months.  Lance Hoh, MD

## 2018-12-12 ENCOUNTER — Other Ambulatory Visit: Payer: Self-pay | Admitting: Psychiatry

## 2018-12-12 DIAGNOSIS — F341 Dysthymic disorder: Secondary | ICD-10-CM

## 2018-12-12 DIAGNOSIS — F4312 Post-traumatic stress disorder, chronic: Secondary | ICD-10-CM

## 2018-12-14 NOTE — Telephone Encounter (Signed)
ADHD and developmental background leave patient needing reminders including for Ambien sent as 10 mg take 1-1/2 tablets nightly for insomnia #45 with no refill sent to [place] at [facility] medically necessary no contraindication with next appointment 12/17/2018.

## 2018-12-14 NOTE — Telephone Encounter (Signed)
appt 12/17/2018

## 2018-12-17 ENCOUNTER — Other Ambulatory Visit: Payer: Self-pay

## 2018-12-17 ENCOUNTER — Encounter: Payer: Self-pay | Admitting: Psychiatry

## 2018-12-17 ENCOUNTER — Ambulatory Visit (INDEPENDENT_AMBULATORY_CARE_PROVIDER_SITE_OTHER): Payer: BLUE CROSS/BLUE SHIELD | Admitting: Psychiatry

## 2018-12-17 VITALS — Ht 73.0 in | Wt 187.0 lb

## 2018-12-17 DIAGNOSIS — F902 Attention-deficit hyperactivity disorder, combined type: Secondary | ICD-10-CM | POA: Diagnosis not present

## 2018-12-17 DIAGNOSIS — F341 Dysthymic disorder: Secondary | ICD-10-CM

## 2018-12-17 DIAGNOSIS — F4312 Post-traumatic stress disorder, chronic: Secondary | ICD-10-CM

## 2018-12-17 MED ORDER — ZOLPIDEM TARTRATE 10 MG PO TABS: 15.0000 mg | ORAL_TABLET | Freq: Every day | ORAL | 5 refills | Status: DC

## 2018-12-17 MED ORDER — RISPERIDONE 0.5 MG PO TABS: 0.5000 mg | ORAL_TABLET | Freq: Every day | ORAL | 5 refills | Status: DC

## 2018-12-17 MED ORDER — GABAPENTIN 600 MG PO TABS: 1200.0000 mg | ORAL_TABLET | Freq: Every day | ORAL | 5 refills | Status: DC

## 2018-12-17 MED ORDER — PAROXETINE HCL 20 MG PO TABS: 20.0000 mg | ORAL_TABLET | Freq: Every day | ORAL | 5 refills | Status: DC

## 2018-12-17 NOTE — Progress Notes (Signed)
[facility]  Patient ID: Lance Matthews,  MRN: [id number]  PCP: Default, Provider, MD  Date of Evaluation: 12/17/2018 Time spent:10 minutes from 1650 to 1700  Chief Complaint:  Chief Complaint    Anxiety; Trauma; Depression; ADHD      HISTORY/CURRENT STATUS: Lance Matthews is seen onsite in office face-to-face individually with consent with epic collateral for psychiatric interview and exam in 57-month evaluation and management of PTSD, dysthymia, and ADHD for which he compensates on the job and in the family without stimulants.  He has chronic treatment concluding that his current medication regimen and his work with psychologist Lance Redbird, PhD have changed his life doubting he will ever returned to his extreme worry, decompensations and confusion.  Posttraumatic stress is his core diagnosis and causes him to avoid going to even primary care for chest pain in the areola despite wife's encouragement.  Wife is working for [employer] in [place] and he works for [employer] having an air conditioned office but otherwise the heat of the summer has contributed to 11 pound weight reduction.  He agrees to come here for medications briefly every 6 months as long as no changes are made.  He comes for only a brief appointment not allowing labs particularly during [place] worried daughter as a senior in high school will not get a graduation ceremony for recognition.  He has not stopped smoking during the pandemic.  However he has not needed the Xanax in several years on his current regimen simply seeking refills today.  He has no delirium, dissociation, psychosis, or mania.   Individual Medical History/ Review of Systems: Changes? :Yes [place] pain and sensitivity for which he refuses to see his primary care due to coronavirus and the status of the nation  Allergies: Patient has no known allergies.  Current Medications:  Current Outpatient Medications:  .  cyclobenzaprine (FLEXERIL) 10 MG tablet,  Take 1 tablet (10 mg total) by mouth 3 (three) times daily as needed for muscle spasms., Disp: 30 tablet, Rfl: 1 .  diphenhydrAMINE (SOMINEX) 25 MG tablet, Take 50 mg by mouth at bedtime as needed for sleep (congestion). Reported on 04/05/2015, Disp: , Rfl:  .  gabapentin (NEURONTIN) 600 MG tablet, Take 2 tablets (1,200 mg total) by mouth at bedtime., Disp: 60 tablet, Rfl: 5 .  ibuprofen (ADVIL,MOTRIN) 200 MG tablet, Take 200-600 mg by mouth every 6 (six) hours as needed for headache or moderate pain., Disp: , Rfl:  .  indomethacin (INDOCIN) 50 MG capsule, Take 1 capsule (50 mg total) by mouth 3 (three) times daily with meals., Disp: 21 capsule, Rfl: 0 .  oxyCODONE-acetaminophen (ROXICET) 5-325 MG tablet, Take 1 tablet by mouth every 8 (eight) hours as needed for severe pain., Disp: 20 tablet, Rfl: 0 .  PARoxetine (PAXIL) 20 MG tablet, Take 1 tablet (20 mg total) by mouth at bedtime., Disp: 30 tablet, Rfl: 5 .  predniSONE (DELTASONE) 20 MG tablet, 3 tabs po qd x2d, 2 tabs po qd x 2d, 1 tabs po qd x2d (Patient not taking: Reported on 04/24/2015), Disp: 12 tablet, Rfl: 0 .  risperiDONE (RISPERDAL) 0.5 MG tablet, Take 1 tablet (0.5 mg total) by mouth at bedtime., Disp: 30 tablet, Rfl: 5 .  zolpidem (AMBIEN) 10 MG tablet, Take 1.5 tablets (15 mg total) by mouth at bedtime., Disp: 45 tablet, Rfl: 5 Medication Side Effects: none  Family Medical/ Social History: Changes? No  MENTAL HEALTH EXAM:  Height 6\' 1"  (1.854 m), weight 187 lb (84.8 kg).Body  mass index is 24.67 kg/m.  Others deferred for coronavirus pending  General Appearance: Casual, Fairly Groomed and Guarded  Eye Contact:  Good  Speech:  Clear and Coherent, Normal Rate and Talkative  Volume:  Normal  Mood:  Anxious, Dysphoric and Euthymic  Affect:  Inappropriate, Restricted and Anxious  Thought Process:  Coherent, Irrelevant and Linear  Orientation:  Full (Time, Place, and Person)  Thought Content: Obsessions and Rumination   Suicidal  Thoughts:  No  Homicidal Thoughts:  No  Memory:  Immediate;   Good Remote;   Fair  Judgement:  Fair  Insight:  Fair  Psychomotor Activity:  Normal, Increased and Restlessness  Concentration:  Concentration: Fair and Attention Span: Fair  Recall:  [employer] of Knowledge: Good  Language: Fair  Assets:  Leisure Time Resilience Talents/Skills  ADL's:  Intact  Cognition: WNL  Prognosis:  Fair    DIAGNOSES:    ICD-10-CM   1. Post-traumatic stress disorder, chronic  F43.12 gabapentin (NEURONTIN) 600 MG tablet    PARoxetine (PAXIL) 20 MG tablet    risperiDONE (RISPERDAL) 0.5 MG tablet    zolpidem (AMBIEN) 10 MG tablet  2. 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 Attention deficit hyperactivity disorder (ADHD), combined type, mild  F90.2     Receiving Psychotherapy: No    RECOMMENDATIONS: He has not required Xanax stimulants for years now.  [place] registry documents recent Ambien fill dispensed and course of filling appropriate.  He is E scribed to continue at [place] [facility] gabapentin 600 mg taking 2 every bedtime, peroxide teen 20 mg every bedtime, risperidone 0.5 mg every bedtime, and zolpidem 10 mg taking 1.5 tablets every bedtime sent as a month supply and 5 refills each for PTSD and dysthymia to return in 6 months.   Delight Hoh, MD

## 2019-06-10 ENCOUNTER — Encounter: Payer: Self-pay | Admitting: Psychiatry

## 2019-06-10 ENCOUNTER — Other Ambulatory Visit: Payer: Self-pay

## 2019-06-10 ENCOUNTER — Ambulatory Visit (INDEPENDENT_AMBULATORY_CARE_PROVIDER_SITE_OTHER): Payer: BC Managed Care – PPO | Admitting: Psychiatry

## 2019-06-10 VITALS — Ht 73.0 in | Wt 194.0 lb

## 2019-06-10 DIAGNOSIS — N522 Drug-induced erectile dysfunction: Secondary | ICD-10-CM | POA: Diagnosis not present

## 2019-06-10 DIAGNOSIS — F4312 Post-traumatic stress disorder, chronic: Secondary | ICD-10-CM | POA: Diagnosis not present

## 2019-06-10 DIAGNOSIS — F902 Attention-deficit hyperactivity disorder, combined type: Secondary | ICD-10-CM

## 2019-06-10 DIAGNOSIS — F341 Dysthymic disorder: Secondary | ICD-10-CM | POA: Diagnosis not present

## 2019-06-10 MED ORDER — PAROXETINE HCL 20 MG PO TABS: 20.0000 mg | ORAL_TABLET | Freq: Every day | ORAL | 5 refills | Status: DC

## 2019-06-10 MED ORDER — RISPERIDONE 0.5 MG PO TABS: 0.5000 mg | ORAL_TABLET | Freq: Every day | ORAL | 5 refills | Status: DC

## 2019-06-10 MED ORDER — GABAPENTIN 600 MG PO TABS: 1200.0000 mg | ORAL_TABLET | Freq: Every day | ORAL | 5 refills | Status: DC

## 2019-06-10 MED ORDER — SILDENAFIL CITRATE 50 MG PO TABS: 50.0000 mg | ORAL_TABLET | Freq: Every day | ORAL | 5 refills | Status: DC | PRN

## 2019-06-10 MED ORDER — ZOLPIDEM TARTRATE 10 MG PO TABS: 15.0000 mg | ORAL_TABLET | Freq: Every day | ORAL | 5 refills | Status: DC

## 2019-06-10 MED ORDER — ALPRAZOLAM 0.5 MG PO TABS: 0.5000 mg | ORAL_TABLET | Freq: Two times a day (BID) | ORAL | 1 refills | Status: DC | PRN

## 2019-06-10 NOTE — Progress Notes (Signed)
[facility]  Patient ID: Lance Matthews,  MRN: [id number]  PCP: Default, Provider, MD  Date of Evaluation: 06/10/2019 Time spent:20 minutes from 1640 to 1700  Chief Complaint:  Chief Complaint    Anxiety; Trauma; Depression; ADHD      HISTORY/CURRENT STATUS: Lance Matthews is seen onsite in office 20 minutes face-to-face individually with consent with epic collateral for psychiatric interview and exam in 40-month evaluation and management of PTSD, atypical dysthymia, and ADHD being more open today to discussing his suspected Paxil induced erectile dysfunction more consequential with wife than occasionally in the past few years. He exhausted his small remaining supply of Xanax  from 2019 in attempting to cope with emotional reexperiencing trauma when ex-wife's father died 3 days after Christmas of suicide as the grandfather of the patient's son and also the patient's daughter lost a diabetic teen friend who died in her sleep when the patient's daughter expected to attend [place] with this girl who is the daughter of the patient's best friend and a male with whom he went to high school.  The patient is very hesitant to change his medication regimen which has helped him have a quality of life for work and family that is much improved and has seemingly prevented decompensations that previously had taken him potentially into the mental hospital himself.  Patient continues his employment at [employer] noting that he should stop cigarettes because he gets out of breath particularly in the summer on the job but worse now wearing a mask.  His wife is stressed by their youngest daughter leaving for [place] next fall and getting ready in the interim.  Patient has no adverse effects from medications otherwise, though he reports gaining some weight with COVID confinement and winter he expects to lose as spring and summer,.  He is opposed to laboratory testing of Risperdal metabolics in  facilities likely to have COVID exposure. He has had no difficulty with his medications in other medical ways.  He has no mania, suicidality, psychosis or delirium.  Depression      The patient presents with depression.  This is a chronic problem.  The current episode started more than 1 year ago.   The onset quality is gradual.   The problem occurs daily.  The problem has been waxing and waning since onset.  Associated symptoms include decreased concentration, insomnia, restlessness, decreased interest and sad.  Associated symptoms include no helplessness, no hopelessness, not irritable, no body aches, no headaches and no suicidal ideas.     The symptoms are aggravated by work stress, social issues and family issues.  Past treatments include SSRIs - Selective serotonin reuptake inhibitors, other medications and psychotherapy.  Compliance with treatment is good.  Past compliance problems include insurance issues and difficulty with treatment plan.  Previous treatment provided significant relief.  Risk factors include a change in medications, emotional abuse, family history, family history of mental illness, history of self-injury, major life event, prior traumatic experience and stress.   Past medical history includes anxiety, depression, mental health disorder and post-traumatic stress disorder.     Pertinent negatives include no life-threatening condition, no physical disability, no recent psychiatric admission, no bipolar disorder, no eating disorder, no obsessive-compulsive disorder, no suicide attempts and no head trauma.   Individual Medical History/ Review of Systems: Changes? :Yes Patient weighed 198 pounds last March then 187 pounds last August now back to 194 pounds.  Medical review of systems relative to drug-induced erectile dysfunction is  otherwise negative at this time except that he is still smoking cigarettes.  Weight is back up 7 pounds after an 11 pound weight reduction from last  March.  Allergies: Patient has no known allergies.  Current Medications:  Current Outpatient Medications:  .  ALPRAZolam (XANAX) 0.5 MG tablet, Take 1 tablet (0.5 mg total) by mouth 2 (two) times daily as needed for anxiety., Disp: 60 tablet, Rfl: 1 .  cyclobenzaprine (FLEXERIL) 10 MG tablet, Take 1 tablet (10 mg total) by mouth 3 (three) times daily as needed for muscle spasms., Disp: 30 tablet, Rfl: 1 .  diphenhydrAMINE (SOMINEX) 25 MG tablet, Take 50 mg by mouth at bedtime as needed for sleep (congestion). Reported on 04/05/2015, Disp: , Rfl:  .  gabapentin (NEURONTIN) 600 MG tablet, Take 2 tablets (1,200 mg total) by mouth at bedtime., Disp: 60 tablet, Rfl: 5 .  ibuprofen (ADVIL,MOTRIN) 200 MG tablet, Take 200-600 mg by mouth every 6 (six) hours as needed for headache or moderate pain., Disp: , Rfl:  .  indomethacin (INDOCIN) 50 MG capsule, Take 1 capsule (50 mg total) by mouth 3 (three) times daily with meals., Disp: 21 capsule, Rfl: 0 .  oxyCODONE-acetaminophen (ROXICET) 5-325 MG tablet, Take 1 tablet by mouth every 8 (eight) hours as needed for severe pain., Disp: 20 tablet, Rfl: 0 .  PARoxetine (PAXIL) 20 MG tablet, Take 1 tablet (20 mg total) by mouth at bedtime., Disp: 30 tablet, Rfl: 5 .  predniSONE (DELTASONE) 20 MG tablet, 3 tabs po qd x2d, 2 tabs po qd x 2d, 1 tabs po qd x2d (Patient not taking: Reported on 04/24/2015), Disp: 12 tablet, Rfl: 0 .  risperiDONE (RISPERDAL) 0.5 MG tablet, Take 1 tablet (0.5 mg total) by mouth at bedtime., Disp: 30 tablet, Rfl: 5 .  sildenafil (VIAGRA) 50 MG tablet, Take 1 tablet (50 mg total) by mouth daily as needed for erectile dysfunction., Disp: 10 tablet, Rfl: 5 .  zolpidem (AMBIEN) 10 MG tablet, Take 1.5 tablets (15 mg total) by mouth at bedtime., Disp: 45 tablet, Rfl: 5  Medication Side Effects: sexual dysfunction  Family Medical/ Social History: Changes? Yes as father of first wife completed suicide 3 days after Christmas and best friend's  diabetic daughter died in her sleep at 21 years.  MENTAL HEALTH EXAM:  Height 6\' 1"  (1.854 m), weight 194 lb (88 kg).Body mass index is 25.6 kg/m. Muscle strengths and tone 5/5, postural reflexes and gait 0/0, and AIMS = 0 otherwise deferred for coronavirus shutdown  General Appearance: Casual, Fairly Groomed and Guarded  Eye Contact:  Fair  Speech:  Clear and Coherent, Normal Rate and Talkative  Volume:  Normal  Mood:  Anxious, Depressed, Dysphoric, Euthymic and Worthless  Affect:  Depressed, Inappropriate, Labile and Anxious  Thought Process:  Coherent, Irrelevant, Linear and Descriptions of Associations: Tangential  Orientation:  Full (Time, Place, and Person)  Thought Content: Paranoid Ideation, Rumination and Tangential   Suicidal Thoughts:  No  Homicidal Thoughts:  No  Memory:  Immediate;   Good Remote;   Good  Judgement:  Fair  Insight:  Good and Fair  Psychomotor Activity:  Normal, Increased, Mannerisms and Restlessness  Concentration:  Concentration: Fair and Attention Span: Fair  Recall:  [employer] of Knowledge: Good  Language: Good  Assets:  Desire for Improvement Resilience Vocational/Educational  ADL's:  Intact  Cognition: WNL  Prognosis:  Fair    DIAGNOSES:    ICD-10-CM   1. Post-traumatic stress disorder, chronic  F43.12  ALPRAZolam (XANAX) 0.5 MG tablet    risperiDONE (RISPERDAL) 0.5 MG tablet    zolpidem (AMBIEN) 10 MG tablet    PARoxetine (PAXIL) 20 MG tablet    gabapentin (NEURONTIN) 600 MG tablet  2. 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 Attention deficit hyperactivity disorder (ADHD), combined type, mild  F90.2   4. Drug-induced erectile dysfunction  N52.2 sildenafil (VIAGRA) 50 MG tablet    Receiving Psychotherapy: No  last with Bernette Redbird, PhD  RECOMMENDATIONS:  Differential for drug-induced ED is reviewed relative to age, unknown family history, lack of any known systemic illness, and known medication potential.  He is E scribed Viagra 50 mg daily as needed #10 with 5 refills sent to [facility].  He is provided a new prescription for Xanax 0.5 mg twice daily for posttraumatic anxiety as needed #60 with 1 refill last provided 01/01/2018 with none on [place] registry except Ambien last dispensed on 05/14/2019.  Chronic medications are updated as Risperdal 0.5 mg every bedtime, zolpidem 10 mg every bedtime as needed for insomnia, Neurontin 300 mg every bedtime, and Paxil 20 mg every bedtime declining to decrease the dose for ED each sent as #30 with 5 refills to [facility] for atypical dysthymia and PTSD.  He returns in 6 months for follow-up or sooner if needed.   Delight Hoh, MD

## 2019-09-14 ENCOUNTER — Encounter: Payer: Self-pay | Admitting: Psychiatry

## 2019-09-14 ENCOUNTER — Other Ambulatory Visit: Payer: Self-pay

## 2019-09-14 ENCOUNTER — Ambulatory Visit (INDEPENDENT_AMBULATORY_CARE_PROVIDER_SITE_OTHER): Payer: BC Managed Care – PPO | Admitting: Psychiatry

## 2019-09-14 ENCOUNTER — Other Ambulatory Visit: Payer: Self-pay | Admitting: Psychiatry

## 2019-09-14 ENCOUNTER — Telehealth: Payer: Self-pay | Admitting: Psychiatry

## 2019-09-14 VITALS — Ht 73.0 in | Wt 191.0 lb

## 2019-09-14 DIAGNOSIS — F4312 Post-traumatic stress disorder, chronic: Secondary | ICD-10-CM

## 2019-09-14 DIAGNOSIS — F902 Attention-deficit hyperactivity disorder, combined type: Secondary | ICD-10-CM

## 2019-09-14 DIAGNOSIS — F341 Dysthymic disorder: Secondary | ICD-10-CM | POA: Diagnosis not present

## 2019-09-14 MED ORDER — RISPERIDONE 1 MG PO TABS: 1.0000 mg | ORAL_TABLET | Freq: Every day | ORAL | 0 refills | Status: DC

## 2019-09-14 MED ORDER — PAROXETINE HCL 20 MG PO TABS: 20.0000 mg | ORAL_TABLET | Freq: Every day | ORAL | 0 refills | Status: DC

## 2019-09-14 NOTE — Progress Notes (Signed)
[facility]  Patient ID: KEVIS OTTAVIANI,  MRN: [id number]  PCP: Default, Provider, MD  Date of Evaluation: 09/14/2019 Time spent:35 minutes from 1145 to 1220  Chief Complaint:  Chief Complaint    Anxiety; Trauma; Depression; ADHD      HISTORY/CURRENT STATUS: Lance Matthews is seen onsite in office face-to-face 35 minutes individually with consent with epic collateral for acute work in appointment for his urgent request this morning by phone for psychiatric interview and exam in 60-month evaluation and management of depression and anxiety much worse over the last several weeks especially staying in bed all day Saturday despite  Memorial holiday.  He did not attend work today calling in sick stating he always has stayed by wife's side until recently when he has been fixated upon bad things happening in his life for the last couple of years become much more depressed.  Anxiety is high but only partially relieved by as needed Xanax which he takes very infrequently.  He is concerned about his passive thoughts of needing to die to escape the prison of his body mentality.  He has not harmed himself and having the thoughts scare him as he is most intolerant of having to be hospitalized avoiding such 6 years ago by updating to his current medication regimen.  At last appointment, he was doing reasonably well except having some erectile dysfunction for which we discussed possibly reducing his Paxil but instead added a low-dose of Viagra and he has no update on that today, not mobilized as he needs more Paxil.  He has been dealing for several years with the suicide of his ex-wife's father to whom he was close worrying he did not answer the man's phone call always and he might have been in distress one such time. He still suffers the injustice of extnded family requiring the immediate move of is family out of their house for somebody else to have the house. His daughter's best friends died in her sleep  likely suicide, so daughter is due to go to college this fall alone taking out her frustration on parents. All of these reminder patient of his traumatic childhood.  He acknowledges he should be back in therapy like that he had with Bernette Redbird, PhD in the past as it was informal at the therapist house, but he cannot tolerate sessions by virtual phone.  He has attempted calls for several therapy options recently all virtual only and he cannot tolerate that.  He does not feel that his workplace recognizes his effort and performance.  He has no substance use other than cigarettes.   Depression  The patient presents with  acute exacerbation for several weeks of depression that is a chronic problem.  The problem started more than 16 years ago.   The exacerbation quality is progressive.   The problem occurs daily.  The problem has been waxing and waning since onset.  Associated symptoms include decreased concentration, insomnia,  posttraumatic reexperiencing, low energy staying in bed, restlessness, decreased interest, rumination, passive suicidal ideation,  helplessness, hopelessness, and sad.  Associated symptoms include no irritability, no body aches, no headaches, no mania, and no psychotic misperceptions.     The symptoms are aggravated by work stress, social issues and family issues.  Past treatments include SSRIs - Selective serotonin reuptake inhibitors, other medications and psychotherapy.  Compliance with treatment is good.  Past compliance problems include insurance issues and difficulty with treatment plan.  Previous treatment provided significant relief.  Risk factors include  a change in medications, emotional abuse, family history, family history of mental illness, history of self-injury, major life event, prior traumatic experience and stress.   Past medical history includes anxiety, depression, mental health disorder and post-traumatic stress disorder.     Pertinent negatives include no  life-threatening condition, no physical disability, no recent psychiatric admission, no bipolar disorder, no eating disorder, no obsessive-compulsive disorder, no suicide attempts and no head trauma.  Individual Medical History/ Review of Systems: Changes? :No Being down 3 pounds from last appointment 3 months ago  Allergies: Patient has no known allergies.  Current Medications:  Current Outpatient Medications:  .  ALPRAZolam (XANAX) 0.5 MG tablet, Take 1 tablet (0.5 mg total) by mouth 2 (two) times daily as needed for anxiety., Disp: 60 tablet, Rfl: 1 .  cyclobenzaprine (FLEXERIL) 10 MG tablet, Take 1 tablet (10 mg total) by mouth 3 (three) times daily as needed for muscle spasms., Disp: 30 tablet, Rfl: 1 .  diphenhydrAMINE (SOMINEX) 25 MG tablet, Take 50 mg by mouth at bedtime as needed for sleep (congestion). Reported on 04/05/2015, Disp: , Rfl:  .  gabapentin (NEURONTIN) 600 MG tablet, Take 2 tablets (1,200 mg total) by mouth at bedtime., Disp: 60 tablet, Rfl: 5 .  ibuprofen (ADVIL,MOTRIN) 200 MG tablet, Take 200-600 mg by mouth every 6 (six) hours as needed for headache or moderate pain., Disp: , Rfl:  .  indomethacin (INDOCIN) 50 MG capsule, Take 1 capsule (50 mg total) by mouth 3 (three) times daily with meals., Disp: 21 capsule, Rfl: 0 .  oxyCODONE-acetaminophen (ROXICET) 5-325 MG tablet, Take 1 tablet by mouth every 8 (eight) hours as needed for severe pain., Disp: 20 tablet, Rfl: 0 .  PARoxetine (PAXIL) 20 MG tablet, Take 1 tablet (20 mg total) by mouth at bedtime., Disp: 30 tablet, Rfl: 5 .  predniSONE (DELTASONE) 20 MG tablet, 3 tabs po qd x2d, 2 tabs po qd x 2d, 1 tabs po qd x2d (Patient not taking: Reported on 04/24/2015), Disp: 12 tablet, Rfl: 0 .  risperiDONE (RISPERDAL) 0.5 MG tablet, Take 1 tablet (0.5 mg total) by mouth at bedtime., Disp: 30 tablet, Rfl: 5 .  sildenafil (VIAGRA) 50 MG tablet, Take 1 tablet (50 mg total) by mouth daily as needed for erectile dysfunction., Disp: 10  tablet, Rfl: 5 .  zolpidem (AMBIEN) 10 MG tablet, Take 1.5 tablets (15 mg total) by mouth at bedtime., Disp: 45 tablet, Rfl: 5  Medication Side Effects: sexual dysfunction last appointment possibly from [place] Social History: Changes? No  MENTAL HEALTH EXAM:  Height 6\' 1"  (1.854 m), weight 191 lb (86.6 kg).Body mass index is 25.2 kg/m. Muscle strengths and tone 5/5, postural reflexes and gait 0/0, and AIMS = 0.  General Appearance: Casual, Fairly Groomed, Guarded and Meticulous  Eye Contact:  Fair  Speech:  Clear and Coherent, Normal Rate and Talkative  Volume:  Normal  Mood:  Anxious, Depressed, Dysphoric, Hopeless and Worthless  Affect:  Congruent, Depressed, Inappropriate, Restricted and Anxious  Thought Process:  Coherent, Goal Directed, Irrelevant, Linear and Descriptions of Associations: Circumstantial  Orientation:  Full (Time, Place, and Person)  Thought Content: Obsessions, Paranoid Ideation and Rumination   Suicidal Thoughts: Yes passive death wish with no intent or plan.  Homicidal Thoughts:  No  Memory:  Immediate;   Good Remote;   Good  Judgement:  Fair  Insight:  Good  Psychomotor Activity:  Decreased and Mannerisms  Concentration:  Concentration: Poor and Attention Span: Fair  Recall:  [employer]el [employer] of Knowledge: Good  Language: Good  Assets:  Desire for Improvement Resilience Social Support Talents/Skills  ADL's:  Intact  Cognition: WNL  Prognosis:  Fair    DIAGNOSES:    ICD-10-CM   1. Persistent depressive disorder with atypical features, currently severe  F34.1   2. Post-traumatic stress disorder, chronic  F43.12   3. Attention deficit hyperactivity disorder (ADHD), combined type, mild  F90.2   4. 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  RECOMMENDATIONS: He addresses  therapy onsite options will make contacts today to start quickly.  He addresses the [facility] hospitalization available only in the community if needed.  He allows reworking of warnings and risk for safety hygiene monitoring and prevention of crisis interventions if needed.  He is encouraged that increasing medications may help both the depression and PTSD acutely.  He has his usual medications in fact just recently renewing these at the pharmacy but overdose of the Risperdal and Paxil which she requires to be done by picking up a new month supply.  He is E scribed to [facility] Risperdal 1 mg every bedtime sent as #30 with no refill and Paxil 40 mg every bedtime sent as #30 with no refill having to resend the Paxil as 40 not 20 mg or PTSD and distant depression.  He continues without change his Ambien 15 mg nightly, Neurontin 1200 mg nightly, and Xanax if needed 0.5 mg twice daily as needed in current supply.  He is currently safe with all of these resources to return in 2 weeks for follow-up or sooner if needed. He does have Viagra as needed for sexual dysfunction side effect if still problematic.  50% of the time is 20 minutes is spent in counseling and coordination of care for trauma focused CBT addressing passive depressive suicidal ideation poor sleep hygiene, object relations coping skills, and frustration management, and behavioral nutrition.  He expects to do better returning to work if he gets a good night sleep tonight.   Delight Hoh, MD

## 2019-09-14 NOTE — Telephone Encounter (Signed)
Noted! Thank you

## 2019-09-14 NOTE — Patient Instructions (Signed)
Increase Paxil 20 mg tablet to 40 mg or 2 tablets daily and increase Risperdal 0.5 mg to 2 tablets or 1 mg daily and a prescription for both is sent for the higher dose as r just 1daily at the higher dose to [facility]

## 2019-09-14 NOTE — Telephone Encounter (Signed)
Appointment provided by reception for 1140 today as patient would not discuss anything other than being off work today with problems.

## 2019-09-14 NOTE — Telephone Encounter (Signed)
Pt is requesting a call back from Dr. Creig Hines. Pt did not state the nature of his call, Only that he wanted to speak with Dr. Creig Hines himself

## 2019-09-15 MED ORDER — PAROXETINE HCL 20 MG PO TABS: 40.0000 mg | ORAL_TABLET | Freq: Every day | ORAL | 0 refills | Status: DC

## 2019-09-28 ENCOUNTER — Ambulatory Visit (INDEPENDENT_AMBULATORY_CARE_PROVIDER_SITE_OTHER): Payer: BC Managed Care – PPO | Admitting: Psychiatry

## 2019-09-28 ENCOUNTER — Encounter: Payer: Self-pay | Admitting: Psychiatry

## 2019-09-28 ENCOUNTER — Other Ambulatory Visit: Payer: Self-pay

## 2019-09-28 VITALS — Ht 73.0 in | Wt 198.0 lb

## 2019-09-28 DIAGNOSIS — F4312 Post-traumatic stress disorder, chronic: Secondary | ICD-10-CM

## 2019-09-28 DIAGNOSIS — F902 Attention-deficit hyperactivity disorder, combined type: Secondary | ICD-10-CM | POA: Diagnosis not present

## 2019-09-28 DIAGNOSIS — F341 Dysthymic disorder: Secondary | ICD-10-CM

## 2019-09-28 MED ORDER — RISPERIDONE 1 MG PO TABS: 1.0000 mg | ORAL_TABLET | Freq: Every day | ORAL | 2 refills | Status: DC

## 2019-09-28 MED ORDER — PAROXETINE HCL 40 MG PO TABS: 40.0000 mg | ORAL_TABLET | Freq: Every day | ORAL | 2 refills | Status: DC

## 2019-09-28 NOTE — Progress Notes (Signed)
[facility]  Patient ID: Lance Matthews,  MRN: [id number]  PCP: Default, Provider, MD  Date of Evaluation: 09/28/2019 Time spent:25 minutes from 1640 to [place]  Chief Complaint:  Chief Complaint    Depression; Anxiety; Trauma      HISTORY/CURRENT STATUS: Lance Matthews is seen onsite in office 25 minutes face-to-face individually with consent with epic collateral for psychiatric interview and exam in 2-week evaluation and management of exacerbation of persistent depressive disorder to severe and chronic PTSD with suicide risk unable that day to be on the job from last appointment.  The interim Paxil was doubled from 20 to 40 mg nightly and the Risperdal from 0.5 to 1 mg nightly.  His suicidal ideation is currently remitted but anxiety is still high wit difficulty to get to sleep.  He has not taken Xanax which works but with hangover, but he does continue his Neurontin and Ambien at bedtime.  He was unsuccessful setting up therapy with Ruben Reason, [facility] who returned his call after a week that he could not be seen until next March.  He has not checked with [place] or resources in this office for therapy but has arranged IOP at [facility] expecting 5 hours of therapy daily on weekdays for 5 weeks or greater integrated with work so that he now has a plan that he feels can be successful for addressing past and present anxiety and depression for him to deal with the family stressors currently as well as in the past.  He does not readily open up about here about early trauma or the subsequent reunion with father though processed for need to include. He states this can be addressed in his upcoming intensive outpatient treatment.  He has addressed financing and is more comfortable now with the need and the mechanism for treatment.  He has medications except increases were sent as a 1 month supply so that interim necessary for the course of his IOP and any PHP are also addressed along with the  need for a therapist for aftercare following IOP.  He is not suicidal, psychotic, delirious or manic today.  Depression             The patient presents withacute exacerbation for the last month of depression that is a chronicproblem.The problem started more than 16 years ago. The exacerbation quality is recently progressive now waxing and waningordered some stabilizing.Associated symptoms include decreased concentration,insomnia, posttraumatic reexperiencing, low energy, rumination, helplessness,hopelessness, decreased interest, and sadness. Associated symptoms includeno irritability,no body aches,no restlessness, no headaches, no mania, no psychotic misperceptions, and no passive suicidal ideation.The symptoms are aggravated by work stress, social issues and family issues.Past treatments include SSRIs - Selective serotonin reuptake inhibitors, other medications and psychotherapy.Compliance with treatment is good.Past compliance problems include insurance issues and difficulty with treatment plan.Previous treatment provided significantrelief.Risk factors include a change in medications, emotional abuse, family history, family history of mental illness, history of self-injury, major life event, prior traumatic experience and stress. Past medical history includes anxiety,depression,mental health disorderand post-traumatic stress disorder. Pertinent negatives include no life-threatening condition,no physical disability,no recent psychiatric admission,no bipolar disorder,no eating disorder,no obsessive-compulsive disorder,no suicide attemptsand no head trauma.  Individual Medical History/ Review of Systems: Changes? :Yes Weight is up 7 pounds in the last 2 weeks after depressive weight loss  Allergies: Patient has no known allergies.  Current Medications:  Current Outpatient Medications:  .  ALPRAZolam (XANAX) 0.5 MG tablet, Take 1 tablet (0.5 mg total) by  mouth 2 (two) times daily  as needed for anxiety., Disp: 60 tablet, Rfl: 1 .  cyclobenzaprine (FLEXERIL) 10 MG tablet, Take 1 tablet (10 mg total) by mouth 3 (three) times daily as needed for muscle spasms., Disp: 30 tablet, Rfl: 1 .  diphenhydrAMINE (SOMINEX) 25 MG tablet, Take 50 mg by mouth at bedtime as needed for sleep (congestion). Reported on 04/05/2015, Disp: , Rfl:  .  gabapentin (NEURONTIN) 600 MG tablet, Take 2 tablets (1,200 mg total) by mouth at bedtime., Disp: 60 tablet, Rfl: 5 .  ibuprofen (ADVIL,MOTRIN) 200 MG tablet, Take 200-600 mg by mouth every 6 (six) hours as needed for headache or moderate pain., Disp: , Rfl:  .  indomethacin (INDOCIN) 50 MG capsule, Take 1 capsule (50 mg total) by mouth 3 (three) times daily with meals., Disp: 21 capsule, Rfl: 0 .  oxyCODONE-acetaminophen (ROXICET) 5-325 MG tablet, Take 1 tablet by mouth every 8 (eight) hours as needed for severe pain., Disp: 20 tablet, Rfl: 0 .  PARoxetine (PAXIL) 40 MG tablet, Take 1 tablet (40 mg total) by mouth at bedtime., Disp: 30 tablet, Rfl: 2 .  predniSONE (DELTASONE) 20 MG tablet, 3 tabs po qd x2d, 2 tabs po qd x 2d, 1 tabs po qd x2d (Patient not taking: Reported on 04/24/2015), Disp: 12 tablet, Rfl: 0 .  risperiDONE (RISPERDAL) 1 MG tablet, Take 1 tablet (1 mg total) by mouth at bedtime., Disp: 30 tablet, Rfl: 2 .  sildenafil (VIAGRA) 50 MG tablet, Take 1 tablet (50 mg total) by mouth daily as needed for erectile dysfunction., Disp: 10 tablet, Rfl: 5 .  zolpidem (AMBIEN) 10 MG tablet, Take 1.5 tablets (15 mg total) by mouth at bedtime., Disp: 45 tablet, Rfl: 5  Medication Side Effects: none  Family Medical/ Social History: Changes? No  MENTAL HEALTH EXAM:  Height 6\' 1"  (1.854 m), weight 198 lb (89.8 kg).Body mass index is 26.12 kg/m. Muscle strengths and tone 5/5, postural reflexes and gait 0/0, and AIMS = 0.  General Appearance: Casual, Fairly Groomed, Guarded and Meticulous  Eye Contact:  Fair  Speech:  Clear  and Coherent and Normal Rate  Volume:  Normal  Mood:  Anxious, Depressed, Dysphoric, Hopeless and Worthless  Affect:  Congruent, Depressed, Inappropriate, Restricted and Anxious  Thought Process:  Coherent, Goal Directed, Irrelevant, Linear and Descriptions of Associations: Circumstantial  Orientation:  Full (Time, Place, and Person)  Thought Content: Ilusions, Obsessions, Paranoid Ideation and Rumination   Suicidal Thoughts:  No  Homicidal Thoughts:  No  Memory:  Immediate;   Good Remote;   Fair  Judgement:  Fair  Insight:  Good and Fair  Psychomotor Activity:  Normal, Decreased and Mannerisms with mild psychomotor retardation  Concentration:  Concentration: Fair and Attention Span: Fair  Recall:  Good  Fund of Knowledge: Good  Language: Good  Assets:  Desire for Improvement Resilience Talents/Skills Vocational/Educational  ADL's:  Intact  Cognition: WNL  Prognosis:  Fair    DIAGNOSES:    ICD-10-CM   1. Persistent depressive disorder with atypical features, currently moderate  F34.1   2. Post-traumatic stress disorder, chronic  F43.12 risperiDONE (RISPERDAL) 1 MG tablet    PARoxetine (PAXIL) 40 MG tablet  3. Attention deficit hyperactivity disorder (ADHD), combined type, mild  F90.2     Receiving Psychotherapy: No but has an IOP start date with [place]: Psychosupportive psychoeducation integrates trauma focused cognitive behavioral sleep hygiene, nutrition, object relations and anger management with symptom treatment matching to conclude to continue Risperdal and Paxil at  increased dose while continuing Neurontin and Ambien as previously, rarely taking Xanax including none in the interim.  Although his suicidal ideation has currently remitted, he is uncertain about verbalizing all of the loss and trauma from the past even in IOP but will have the support and containment of the peers which he can reproduce in the session today to take with him into his  program that he states will only be 8 patients in his particular IOP.  Prevention and monitoring safety hygiene and crisis plans are updated.  He is E scribed Paxil 40 mg tablet every bedtime sent as #30 with 2 refills to [facility] for atypical dysthymia and PTSD.  He is E scribed Risperdal 1 mg every bedtime sent as #30 with 2 refills to [facility] for PTSD and atypical dysthymia.  He returns for follow-up in 10 weeks or as needs to be adjusted for IOP programming and discharge expectations.   Delight Hoh, MD

## 2019-09-29 ENCOUNTER — Encounter: Payer: Self-pay | Admitting: Psychiatry

## 2019-12-07 ENCOUNTER — Other Ambulatory Visit: Payer: Self-pay

## 2019-12-07 ENCOUNTER — Encounter: Payer: Self-pay | Admitting: Psychiatry

## 2019-12-07 ENCOUNTER — Ambulatory Visit (INDEPENDENT_AMBULATORY_CARE_PROVIDER_SITE_OTHER): Payer: BC Managed Care – PPO | Admitting: Psychiatry

## 2019-12-07 VITALS — Ht 73.0 in | Wt 197.0 lb

## 2019-12-07 DIAGNOSIS — N522 Drug-induced erectile dysfunction: Secondary | ICD-10-CM

## 2019-12-07 DIAGNOSIS — F341 Dysthymic disorder: Secondary | ICD-10-CM

## 2019-12-07 DIAGNOSIS — F4312 Post-traumatic stress disorder, chronic: Secondary | ICD-10-CM

## 2019-12-07 DIAGNOSIS — F902 Attention-deficit hyperactivity disorder, combined type: Secondary | ICD-10-CM

## 2019-12-07 MED ORDER — ZOLPIDEM TARTRATE 10 MG PO TABS: 15.0000 mg | ORAL_TABLET | Freq: Every day | ORAL | 5 refills | Status: DC

## 2019-12-07 MED ORDER — SILDENAFIL CITRATE 50 MG PO TABS: 50.0000 mg | ORAL_TABLET | Freq: Every day | ORAL | 5 refills | Status: DC | PRN

## 2019-12-07 MED ORDER — PAROXETINE HCL 40 MG PO TABS: 20.0000 mg | ORAL_TABLET | Freq: Two times a day (BID) | ORAL | 5 refills | Status: DC

## 2019-12-07 MED ORDER — GABAPENTIN 600 MG PO TABS: 1200.0000 mg | ORAL_TABLET | Freq: Every day | ORAL | 5 refills | Status: DC

## 2019-12-07 MED ORDER — RISPERIDONE 1 MG PO TABS: 0.5000 mg | ORAL_TABLET | Freq: Two times a day (BID) | ORAL | 5 refills | Status: DC

## 2019-12-08 ENCOUNTER — Ambulatory Visit: Payer: BC Managed Care – PPO | Admitting: Psychiatry

## 2019-12-08 NOTE — Progress Notes (Signed)
[facility]  Patient ID: FARAH BENISH,  MRN: [id number]  PCP: Default, Provider, MD  Date of Evaluation: 12/07/2019 Time spent:25 minutes from 1700 to [place] Complaint:  Chief Complaint    Depression; Anxiety; Trauma; ADHD; Sexual Problem      HISTORY/CURRENT STATUS: Gerald Stabs is seen onsite in office 25 minutes face-to-face individually with consent with epic collateral for psychiatric interview and exam in 10-week evaluation and management of atypical dysthymia, PTSD, ADHD and drug-induced erectile dysfunction.  The patient arrives 20 minutes late for appointment but is found to have an appointment for this time of arrival on the schedule for tomorrow as well choosing to take a delayed after-hours appointment today as he has a suddenly called meeting with his employer at work tomorrow at 5 PM.  The patient describes that he was demoted returning to work after IOP/PHP from his usual [employer] position having someone covering during his [place] treatment, patient describing somewhat that this person is trying to take over his job.  Patient is therefore angry and despondent about his job ready to give up position and punishment for his time off for treatment.  He states he can always find another job but is encouraged to make the most with skills he has acquired in treatment to sort his sense of sleepiness and recognition at work.  As to pending stressors during his decompensation requiring IOP, his daughter made it to college leaving parents behind who are still stressed even though she was able to overcome the death of her college to be best friend to make it to college herself.  The patient drove by the previous home of the father of his first wife still stressed by this man's suicidet.  He was able to resolve in his IOP and PHP wife's parents suddenly removing them the last time renting from her parents so that he and wife are doing well together.  He reviews  being back to work 2 weeks after 28 days in IOP and PHP of 2 weeks, now having Zoom aftercare follow-up for maintenance of treatment also having medication provider with recommendations from his program that may motivate him to change to medication treatment there.  He is seeing Steffanie Rainwater, [facility] for aftercare with introductory session last week starating therapy intensively this week.  The patient asks about divided dosing of at least a couple of his medications to better cover daytime symptoms as per youth group therapy suggestions.  However the patient is somewhat supersensitized by his investment in the intensive outpatient treatment program and his difficulty disengaging from that to return to daily life, though he is accomplishing this in a stepwise fashion. He thereby no longer requires all of his medications be at bedtime.  He has no mania, suicidality, psychosis or delirium.  Depression The patient presents withacute exacerbation for the last 3 months ofdepressionthatis already a chronicproblem.Theproblemstarted more than 16yearsago. Theexacerbationis now waxing and waningordered toward stabilizing.Associated symptoms include relative apathetic let down from the highly emotionally aroused IOP he had deferred for years, relative disregard after overcoming acute losses, decreased concentration,insomnia,posttraumatic reexperiencing, low energy,rumination, decreased interest, and reactive sadness. Associated symptoms includeno irritability,no body aches, nohelplessness,no self-harm, no hopelessness, no restlessness, no headaches, no mania, nopsychotic misperceptions, and no passive suicidal ideation.The symptoms are aggravated by work stress, social issues and family issues.Past treatments include SSRIs - Selective serotonin reuptake inhibitors, other medications and psychotherapy.Compliance with treatment is good.Past compliance problems include insurance  issues and difficulty with treatment  plan.Previous treatment provided significantrelief.Risk factors include a change in medications, emotional abuse, family history, family history of mental illness, history of self-injury, major life event, prior traumatic experience and stress. Past medical history includes anxiety,depression,mental health disorderand post-traumatic stress disorder. Pertinent negatives include no life-threatening condition,no physical disability,no recent psychiatric admission,no bipolar disorder,no eating disorder,no obsessive-compulsive disorder,no suicide attemptsand no head trauma.  Individual Medical History/ Review of Systems: Changes? :Yes Weight is stable though the patient states he just does not feel like eating as though no interest or appetite rather than a full sensation or other discomfort in the GI tract.  However he is very pleased with his Viagra stating it seems as though blowing out of the wind may mobilize sexual activity for he and wife.  Allergies: Patient has no known allergies.  Current Medications:  Current Outpatient Medications:    ALPRAZolam (XANAX) 0.5 MG tablet, Take 1 tablet (0.5 mg total) by mouth 2 (two) times daily as needed for anxiety., Disp: 60 tablet, Rfl: 1   cyclobenzaprine (FLEXERIL) 10 MG tablet, Take 1 tablet (10 mg total) by mouth 3 (three) times daily as needed for muscle spasms., Disp: 30 tablet, Rfl: 1   diphenhydrAMINE (SOMINEX) 25 MG tablet, Take 50 mg by mouth at bedtime as needed for sleep (congestion). Reported on 04/05/2015, Disp: , Rfl:    gabapentin (NEURONTIN) 600 MG tablet, Take 2 tablets (1,200 mg total) by mouth at bedtime., Disp: 60 tablet, Rfl: 5   ibuprofen (ADVIL,MOTRIN) 200 MG tablet, Take 200-600 mg by mouth every 6 (six) hours as needed for headache or moderate pain., Disp: , Rfl:    indomethacin (INDOCIN) 50 MG capsule, Take 1 capsule (50 mg total) by mouth 3 (three) times daily with meals.,  Disp: 21 capsule, Rfl: 0   oxyCODONE-acetaminophen (ROXICET) 5-325 MG tablet, Take 1 tablet by mouth every 8 (eight) hours as needed for severe pain., Disp: 20 tablet, Rfl: 0   PARoxetine (PAXIL) 40 MG tablet, Take 0.5 tablets (20 mg total) by mouth in the morning and at bedtime., Disp: 30 tablet, Rfl: 5   predniSONE (DELTASONE) 20 MG tablet, 3 tabs po qd x2d, 2 tabs po qd x 2d, 1 tabs po qd x2d (Patient not taking: Reported on 04/24/2015), Disp: 12 tablet, Rfl: 0   risperiDONE (RISPERDAL) 1 MG tablet, Take 0.5 tablets (0.5 mg total) by mouth in the morning and at bedtime., Disp: 30 tablet, Rfl: 5   sildenafil (VIAGRA) 50 MG tablet, Take 1 tablet (50 mg total) by mouth daily as needed for erectile dysfunction., Disp: 10 tablet, Rfl: 5   zolpidem (AMBIEN) 10 MG tablet, Take 1.5 tablets (15 mg total) by mouth at bedtime., Disp: 45 tablet, Rfl: 5  Medication Side Effects: sexual dysfunction  Family Medical/ Social History: Changes? No  MENTAL HEALTH EXAM:  Height 6\' 1"  (1.854 m), weight 197 lb (89.4 kg).Body mass index is 25.99 kg/m. Muscle strengths and tone 5/5, postural reflexes and gait 0/0, and AIMS = 0.  General Appearance: Casual, Guarded, Meticulous and Well Groomed  Eye Contact:  Fair  Speech:  Clear and Coherent, Normal Rate and Slow  Volume:  Normal  Mood:  Anxious, Dysphoric and Irritable  Affect:  Congruent, Depressed, Inappropriate, Restricted and Anxious  Thought Process:  Coherent, Goal Directed, Irrelevant, Linear and Descriptions of Associations: Circumstantial  Orientation:  Full (Time, Place, and Person)  Thought Content: Ilusions, Obsessions and Rumination   Suicidal Thoughts:  No  Homicidal Thoughts:  No  Memory:  Immediate;  Good Remote;   Fair  Judgement:  Fair  Insight:  Good  Psychomotor Activity:  Normal and Mannerisms  Concentration:  Concentration: Fair and Attention Span: Fair  Recall:  [employer] of Knowledge: Good  Language: Good  Assets:  Desire  for Improvement Intimacy Leisure Time Resilience  ADL's:  Intact  Cognition: WNL  Prognosis:  Fair    DIAGNOSES:    ICD-10-CM   1. Persistent depressive disorder with atypical features, currently moderate  F34.1 zolpidem (AMBIEN) 10 MG tablet    risperiDONE (RISPERDAL) 1 MG tablet    PARoxetine (PAXIL) 40 MG tablet  2. Post-traumatic stress disorder, chronic  F43.12 zolpidem (AMBIEN) 10 MG tablet    risperiDONE (RISPERDAL) 1 MG tablet    PARoxetine (PAXIL) 40 MG tablet    gabapentin (NEURONTIN) 600 MG tablet  3. Attention deficit hyperactivity disorder (ADHD), combined type, mild  F90.2   4. Drug-induced erectile dysfunction  N52.2 sildenafil (VIAGRA) 50 MG tablet    Receiving Psychotherapy: Yes  with Steffanie Rainwater, [facility] for individual aftercare, virtual therapy aftercare as post IOP readjustment and application and possibly to follow having medications through a provider with [place]: Patient is displeased with the let down adjustment to being out of intensive psychotherapeutic programming and in the restarting of routine work and aftercare.  Every attempt is made to help him not give up on his job or peers there as well as family losses necessitating the IOP treatment.  He prepares for his employment meeting tomorrow at 5 PM and is provided a work excuse for today.  He is congratulated reinforcing to consolidate the accomplishments of his therapy, and closure of care here is likely though he understands resources follow-up here as he has every 6 months if needed.  He is E scribed Risperdal 1 mg tablet taking 1/2 tablet every morning and at bedtime sent as #30 with 5 refills to [facility] on [address] for major depression and PTSD.  He is E scribed Paxil 40 mg tablet take 1/2 tablet every morning and bedtime other than all at bedtime sent as #30 with 5 refills to [facility] on [address] for dysthymia and PTSD.  He is E scribed Ambien 10  mg tablet taking 1.5 tab total 15 mg every bedtime sent to his #45 with 5 refills [facility] Opp[facility] on [address].  He is E scribed Neurontin 600 mg taking 2 tablets every bedtime anxiety #60 with 5 refills sent to K[facility] Fantasia for PTSD insomnia.  He has current supply of Xanax 0.5 mg twice daily as needed for anxiety of PTSD rarely taking now.  He is E scribed Viagra 50 mg tablet daily as needed sent as #10 with 5 refills [facility] Fantasia for erectile dysfunction that is drug-induced.  He is provided a follow-up appointment in 4 weeks understanding mechanism of transfer of records if care elsewhere or to continue here in 3 to 6 months attempting to assure completion of the closure of his PE in order to benefit most from his individual care, Zoom group aftercare, and medication management.  Delight Hoh, MD

## 2019-12-14 ENCOUNTER — Telehealth (HOSPITAL_COMMUNITY): Payer: Self-pay | Admitting: Professional

## 2020-01-10 ENCOUNTER — Encounter: Payer: Self-pay | Admitting: Psychiatry

## 2020-01-10 ENCOUNTER — Other Ambulatory Visit: Payer: Self-pay

## 2020-01-10 ENCOUNTER — Ambulatory Visit (INDEPENDENT_AMBULATORY_CARE_PROVIDER_SITE_OTHER): Payer: BC Managed Care – PPO | Admitting: Psychiatry

## 2020-01-10 VITALS — Ht 73.0 in | Wt 193.0 lb

## 2020-01-10 DIAGNOSIS — N522 Drug-induced erectile dysfunction: Secondary | ICD-10-CM | POA: Diagnosis not present

## 2020-01-10 DIAGNOSIS — F341 Dysthymic disorder: Secondary | ICD-10-CM | POA: Diagnosis not present

## 2020-01-10 DIAGNOSIS — F4312 Post-traumatic stress disorder, chronic: Secondary | ICD-10-CM | POA: Diagnosis not present

## 2020-01-10 DIAGNOSIS — F902 Attention-deficit hyperactivity disorder, combined type: Secondary | ICD-10-CM | POA: Diagnosis not present

## 2020-01-10 MED ORDER — ALPRAZOLAM 0.5 MG PO TABS: 0.5000 mg | ORAL_TABLET | Freq: Two times a day (BID) | ORAL | 1 refills | Status: DC | PRN
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
## 2020-02-03 ENCOUNTER — Encounter: Payer: Self-pay | Admitting: Psychiatry

## 2020-05-07 ENCOUNTER — Telehealth: Payer: Self-pay | Admitting: Psychiatry

## 2020-05-07 DIAGNOSIS — F341 Dysthymic disorder: Secondary | ICD-10-CM

## 2020-05-07 DIAGNOSIS — F4312 Post-traumatic stress disorder, chronic: Secondary | ICD-10-CM

## 2020-05-08 NOTE — Telephone Encounter (Signed)
Have patient get scheduled with new provider

## 2020-06-05 ENCOUNTER — Other Ambulatory Visit: Payer: Self-pay | Admitting: Psychiatry

## 2020-06-05 DIAGNOSIS — F4312 Post-traumatic stress disorder, chronic: Secondary | ICD-10-CM

## 2020-06-05 DIAGNOSIS — F341 Dysthymic disorder: Secondary | ICD-10-CM

## 2020-06-05 NOTE — Telephone Encounter (Signed)
Reviewed, thank you for follow up

## 2020-06-05 NOTE — Telephone Encounter (Signed)
Pt no longer needs our service. Had new provider.

## 2020-08-29 ENCOUNTER — Other Ambulatory Visit (HOSPITAL_COMMUNITY): Payer: Self-pay | Admitting: Psychiatry

## 2020-09-18 ENCOUNTER — Other Ambulatory Visit (HOSPITAL_COMMUNITY): Payer: Self-pay | Admitting: Psychiatry

## 2020-09-18 DIAGNOSIS — F341 Dysthymic disorder: Secondary | ICD-10-CM

## 2020-09-18 DIAGNOSIS — F4312 Post-traumatic stress disorder, chronic: Secondary | ICD-10-CM

## 2020-09-18 MED ORDER — ZOLPIDEM TARTRATE 10 MG PO TABS
15.0000 mg | ORAL_TABLET | Freq: Every day | ORAL | 5 refills | Status: DC
Start: 2020-09-18 — End: 2020-09-26

## 2020-09-25 ENCOUNTER — Other Ambulatory Visit: Payer: Self-pay

## 2020-09-25 ENCOUNTER — Encounter (HOSPITAL_COMMUNITY): Payer: Self-pay

## 2020-09-25 ENCOUNTER — Telehealth (INDEPENDENT_AMBULATORY_CARE_PROVIDER_SITE_OTHER): Payer: BC Managed Care – PPO | Admitting: Psychiatry

## 2020-09-25 DIAGNOSIS — F4312 Post-traumatic stress disorder, chronic: Secondary | ICD-10-CM

## 2020-09-25 DIAGNOSIS — F341 Dysthymic disorder: Secondary | ICD-10-CM
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
## 2020-09-26 ENCOUNTER — Other Ambulatory Visit (HOSPITAL_COMMUNITY): Payer: Self-pay | Admitting: Psychiatry

## 2020-09-26 DIAGNOSIS — F4312 Post-traumatic stress disorder, chronic: Secondary | ICD-10-CM

## 2020-09-26 DIAGNOSIS — F341 Dysthymic disorder: Secondary | ICD-10-CM

## 2020-09-26 MED ORDER — ZOLPIDEM TARTRATE 10 MG PO TABS: 15.0000 mg | ORAL_TABLET | Freq: Every day | ORAL | 5 refills | Status: DC

## 2020-10-02 ENCOUNTER — Other Ambulatory Visit (HOSPITAL_COMMUNITY): Payer: Self-pay | Admitting: Psychiatry

## 2020-10-02 DIAGNOSIS — F4312 Post-traumatic stress disorder, chronic: Secondary | ICD-10-CM

## 2020-10-02 NOTE — Progress Notes (Signed)
Gabapentin refilled per call to pharmacy.

## 2020-11-20 ENCOUNTER — Encounter (HOSPITAL_COMMUNITY): Payer: Self-pay

## 2020-11-20 ENCOUNTER — Other Ambulatory Visit: Payer: Self-pay

## 2020-11-20 ENCOUNTER — Telehealth (INDEPENDENT_AMBULATORY_CARE_PROVIDER_SITE_OTHER): Payer: BC Managed Care – PPO | Admitting: Psychiatry

## 2020-11-20 DIAGNOSIS — F341 Dysthymic disorder: Secondary | ICD-10-CM

## 2020-11-20 DIAGNOSIS — F4312 Post-traumatic stress disorder, chronic: Secondary | ICD-10-CM

## 2020-11-20 MED ORDER — FLUOXETINE HCL 20 MG PO CAPS: 20.0000 mg | ORAL_CAPSULE | Freq: Every day | ORAL | 6 refills | Status: DC

## 2020-11-20 MED ORDER — ZOLPIDEM TARTRATE 10 MG PO TABS
15.0000 mg | ORAL_TABLET | Freq: Every day | ORAL | 5 refills | Status: DC
Start: 2020-11-20 — End: 2021-04-26

## 2020-11-20 MED ORDER — RISPERIDONE 1 MG PO TABS: 1.0000 mg | ORAL_TABLET | Freq: Every day | ORAL | 6 refills | Status: DC

## 2020-11-20 MED ORDER — GABAPENTIN 600 MG PO TABS
600.0000 mg | ORAL_TABLET | Freq: Every day | ORAL | 6 refills | Status: DC
Start: 2020-11-20 — End: 2021-04-26
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
## 2021-02-12 ENCOUNTER — Ambulatory Visit: Payer: BC Managed Care – PPO | Admitting: Podiatry

## 2021-02-12 ENCOUNTER — Ambulatory Visit (INDEPENDENT_AMBULATORY_CARE_PROVIDER_SITE_OTHER): Payer: BC Managed Care – PPO

## 2021-02-12 ENCOUNTER — Encounter: Payer: Self-pay | Admitting: Podiatry

## 2021-02-12 ENCOUNTER — Other Ambulatory Visit: Payer: Self-pay

## 2021-02-12 DIAGNOSIS — M778 Other enthesopathies, not elsewhere classified: Secondary | ICD-10-CM | POA: Diagnosis not present

## 2021-02-12 DIAGNOSIS — M7752 Other enthesopathy of left foot: Secondary | ICD-10-CM | POA: Diagnosis not present

## 2021-02-12 DIAGNOSIS — M7750 Other enthesopathy of unspecified foot: Secondary | ICD-10-CM

## 2021-02-12 DIAGNOSIS — M7751 Other enthesopathy of right foot: Secondary | ICD-10-CM

## 2021-02-12 MED ORDER — TRIAMCINOLONE ACETONIDE 10 MG/ML IJ SUSP
20.0000 mg | Freq: Once | INTRAMUSCULAR | Status: AC
  Administered 2021-02-12: 20 mg

## 2021-02-12 NOTE — Progress Notes (Signed)
Subjective:   Patient ID: Lance Matthews, male   DOB: 50 y.o.   MRN: [id number]   HPI Patient states he has a lot of pain in the bottom of both feet with the left being in a different area than the right but both of them bother him and then there is a generalized pain.  Patient only works part-time currently and does smoke 1 pack/day and tries to be active   Review of Systems  All other systems reviewed and are negative.      Objective:  Physical Exam Vitals and nursing note reviewed.  Constitutional:      Appearance: He is well-developed.  Pulmonary:     Effort: Pulmonary effort is normal.  Musculoskeletal:        General: Normal range of motion.  Skin:    General: Skin is warm.  Neurological:     Mental Status: He is alert.    Neurovascular status intact muscle strength adequate range of motion within normal limits with patient found to have quite a bit of discomfort second metatarsal phalangeal joint left with inflammation fluid around the joint surface mild discomfort with possibility for neuroma symptomatology left third interspace and right and inflammation fluid around the fifth metatarsal head right along with the second MPJ not to the same degree as the left.  Found to have good digital perfusion well oriented x3     Assessment:  Inflammatory capsulitis second MPJ left fifth MPJ right and possibility for neuroma symptomatology     Plan:  H&P x-rays reviewed conditions discussed.  At this point I did a sterile prep of the left forefoot I then aspirated the left second MPJ getting out a small amount of clear fluid injected quarter cc dexamethasone Kenalog and advised on rigid bottom shoes and for the right I did sterile prep and injected around the fifth MPJ 3 mg dexamethasone Kenalog 5 mg Xylocaine discussed we may need to treat other things and it may turn out there is more to his condition then just these 2 problems.  I educated him on this at this time  X-rays  indicate that there is no signs that this is a acute advanced arthritic condition appears to be more inflammatory

## 2021-03-07 ENCOUNTER — Ambulatory Visit: Payer: BC Managed Care – PPO | Admitting: Podiatry

## 2021-03-07 ENCOUNTER — Encounter: Payer: Self-pay | Admitting: Podiatry

## 2021-03-07 ENCOUNTER — Other Ambulatory Visit: Payer: Self-pay

## 2021-03-07 DIAGNOSIS — D3610 Benign neoplasm of peripheral nerves and autonomic nervous system, unspecified: Secondary | ICD-10-CM

## 2021-03-07 NOTE — Progress Notes (Signed)
Subjective:   Patient ID: Lance Matthews, male   DOB: 50 y.o.   MRN: [id number]   HPI Patient states he still having a lot of pain left over right foot that he does not seem to be much better and states he feels like he is on the ball and at times he is getting sharp shooting pains between the digits   ROS      Objective:  Physical Exam  Neurovascular status intact muscle strength adequate motion adequate with what appears to be some symptoms which are coming from the third interspace left foot with radiating discomfort along with generalized pain     Assessment:  Possibility that neuroma is part of this versus inflammatory condition or combination     Plan:  H&P went ahead sterile prep and did inject the third interspace with a therapeutic nerve block along with some dexamethasone and neurolysis agent and I want to see the response over 2 weeks and may have to consider different treatment options depending on response

## 2021-03-21 ENCOUNTER — Ambulatory Visit: Payer: BC Managed Care – PPO | Admitting: Podiatry

## 2021-03-22 ENCOUNTER — Encounter: Payer: Self-pay | Admitting: Podiatry

## 2021-03-22 ENCOUNTER — Ambulatory Visit: Payer: BC Managed Care – PPO | Admitting: Podiatry

## 2021-03-22 ENCOUNTER — Other Ambulatory Visit: Payer: Self-pay

## 2021-03-22 DIAGNOSIS — D3610 Benign neoplasm of peripheral nerves and autonomic nervous system, unspecified: Secondary | ICD-10-CM

## 2021-03-22 DIAGNOSIS — M778 Other enthesopathies, not elsewhere classified: Secondary | ICD-10-CM

## 2021-03-22 MED ORDER — DICLOFENAC SODIUM 75 MG PO TBEC: 75.0000 mg | DELAYED_RELEASE_TABLET | Freq: Two times a day (BID) | ORAL | 2 refills | Status: DC

## 2021-03-22 NOTE — Progress Notes (Signed)
Subjective:   Patient ID: Lance Matthews, male   DOB: 50 y.o.   MRN: [id number]   HPI Patient presents stating that he is getting pain left over right foot and he has had no improvement in pain or numbness.   ROS      Objective:  Physical Exam  Neurovascular status intact with discomfort in right over left foot that is very difficult to make complete determination with patient stating it is giving him trouble for years but more recently and seems to shoot and he thinks it comes between the second and third toes but he is not sure and it can involve his entire forefoot     Assessment:  Neuroma symptomatology possible versus capsulitis other unknown condition     Plan:  H&P discussed the possibility for MRI or simply going in and excise any what appears to be enlargement of nerve tissue between the second interspace.  I did go over at great length there is no guarantee this will solve this and I spent a great deal of time educating him on this and currently organ to hold off and just treat conservatively he is can make shoe gear choices we placed him on an anti-inflammatory and I want to see results

## 2021-04-26 ENCOUNTER — Other Ambulatory Visit (HOSPITAL_COMMUNITY): Payer: Self-pay | Admitting: Psychiatry

## 2021-04-26 DIAGNOSIS — F4312 Post-traumatic stress disorder, chronic: Secondary | ICD-10-CM

## 2021-04-26 DIAGNOSIS — F341 Dysthymic disorder: Secondary | ICD-10-CM

## 2021-04-26 MED ORDER — FLUOXETINE HCL 20 MG PO CAPS: 20.0000 mg | ORAL_CAPSULE | Freq: Every day | ORAL | 6 refills | Status: DC

## 2021-04-26 MED ORDER — RISPERIDONE 1 MG PO TABS: 1.0000 mg | ORAL_TABLET | Freq: Every day | ORAL | 6 refills | Status: DC

## 2021-04-26 MED ORDER — ALPRAZOLAM 0.5 MG PO TABS: 0.5000 mg | ORAL_TABLET | Freq: Two times a day (BID) | ORAL | 1 refills | Status: DC | PRN

## 2021-04-26 MED ORDER — ZOLPIDEM TARTRATE 10 MG PO TABS: 15.0000 mg | ORAL_TABLET | Freq: Every day | ORAL | 5 refills | Status: DC

## 2021-04-26 MED ORDER — GABAPENTIN 600 MG PO TABS: 600.0000 mg | ORAL_TABLET | Freq: Every day | ORAL | 6 refills | Status: DC

## 2021-04-26 NOTE — Progress Notes (Signed)
Client is doing well with no issues, no side effects.  Mood is steady, returned to work part-time which has been helpful for his boredom.  Rx refilled and he is making a follow up appointment shortly.  [name], PMHNP

## 2021-05-21 ENCOUNTER — Telehealth (HOSPITAL_COMMUNITY): Payer: BC Managed Care – PPO

## 2021-05-22 ENCOUNTER — Ambulatory Visit (INDEPENDENT_AMBULATORY_CARE_PROVIDER_SITE_OTHER): Payer: BC Managed Care – PPO | Admitting: Psychiatry

## 2021-05-22 ENCOUNTER — Encounter (HOSPITAL_COMMUNITY): Payer: Self-pay | Admitting: Psychiatry

## 2021-05-22 ENCOUNTER — Other Ambulatory Visit: Payer: Self-pay

## 2021-05-22 DIAGNOSIS — F331 Major depressive disorder, recurrent, moderate: Secondary | ICD-10-CM | POA: Insufficient documentation

## 2021-05-22 DIAGNOSIS — F330 Major depressive disorder, recurrent, mild: Secondary | ICD-10-CM | POA: Diagnosis not present

## 2021-05-22 DIAGNOSIS — F4312 Post-traumatic stress disorder, chronic: Secondary | ICD-10-CM

## 2021-05-22 MED ORDER — FLUOXETINE HCL 20 MG PO CAPS: 20.0000 mg | ORAL_CAPSULE | Freq: Every day | ORAL | 6 refills | Status: DC

## 2021-05-22 MED ORDER — GABAPENTIN 600 MG PO TABS: 600.0000 mg | ORAL_TABLET | Freq: Every day | ORAL | 6 refills | Status: DC

## 2021-05-22 MED ORDER — ALPRAZOLAM 0.5 MG PO TABS: 0.5000 mg | ORAL_TABLET | Freq: Two times a day (BID) | ORAL | 1 refills | Status: DC | PRN

## 2021-05-22 MED ORDER — ZOLPIDEM TARTRATE 10 MG PO TABS: 15.0000 mg | ORAL_TABLET | Freq: Every day | ORAL | 5 refills | Status: DC

## 2021-05-22 MED ORDER — RISPERIDONE 1 MG PO TABS: 1.0000 mg | ORAL_TABLET | Freq: Every day | ORAL | 6 refills | Status: DC
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
## 2021-11-06 ENCOUNTER — Telehealth (INDEPENDENT_AMBULATORY_CARE_PROVIDER_SITE_OTHER): Payer: BC Managed Care – PPO | Admitting: Psychiatry

## 2021-11-06 ENCOUNTER — Encounter (HOSPITAL_COMMUNITY): Payer: Self-pay | Admitting: Psychiatry

## 2021-11-06 DIAGNOSIS — F4312 Post-traumatic stress disorder, chronic: Secondary | ICD-10-CM | POA: Diagnosis not present

## 2021-11-06 DIAGNOSIS — F330 Major depressive disorder, recurrent, mild: Secondary | ICD-10-CM | POA: Diagnosis not present

## 2021-11-06 MED ORDER — ALPRAZOLAM 0.5 MG PO TABS: 0.5000 mg | ORAL_TABLET | Freq: Two times a day (BID) | ORAL | 1 refills | Status: DC | PRN

## 2021-11-06 MED ORDER — ZOLPIDEM TARTRATE 10 MG PO TABS: 15.0000 mg | ORAL_TABLET | Freq: Every day | ORAL | 5 refills | Status: DC

## 2021-11-06 MED ORDER — RISPERIDONE 1 MG PO TABS: 1.0000 mg | ORAL_TABLET | Freq: Every day | ORAL | 6 refills | Status: DC

## 2021-11-06 MED ORDER — GABAPENTIN 600 MG PO TABS: 600.0000 mg | ORAL_TABLET | Freq: Every day | ORAL | 0 refills | Status: DC

## 2021-11-06 MED ORDER — FLUOXETINE HCL 20 MG PO CAPS: 20.0000 mg | ORAL_CAPSULE | Freq: Every day | ORAL | 0 refills | Status: DC
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
## 2021-11-29 ENCOUNTER — Other Ambulatory Visit: Payer: Self-pay | Admitting: Gastroenterology

## 2021-11-29 DIAGNOSIS — Z789 Other specified health status: Secondary | ICD-10-CM

## 2021-11-29 DIAGNOSIS — R7989 Other specified abnormal findings of blood chemistry: Secondary | ICD-10-CM

## 2021-12-04 ENCOUNTER — Ambulatory Visit
Admission: RE | Admit: 2021-12-04 | Discharge: 2021-12-04 | Disposition: A | Discharge status: Home / Self Care | Payer: BC Managed Care – PPO | Source: Ambulatory Visit | Attending: Gastroenterology | Admitting: Gastroenterology

## 2021-12-04 DIAGNOSIS — R7989 Other specified abnormal findings of blood chemistry: Secondary | ICD-10-CM

## 2021-12-04 DIAGNOSIS — Z789 Other specified health status: Secondary | ICD-10-CM

## 2021-12-05 ENCOUNTER — Other Ambulatory Visit: Payer: Self-pay | Admitting: Gastroenterology

## 2021-12-05 DIAGNOSIS — R140 Abdominal distension (gaseous): Secondary | ICD-10-CM

## 2021-12-05 DIAGNOSIS — R935 Abnormal findings on diagnostic imaging of other abdominal regions, including retroperitoneum: Secondary | ICD-10-CM

## 2021-12-05 DIAGNOSIS — R7989 Other specified abnormal findings of blood chemistry: Secondary | ICD-10-CM

## 2021-12-20 ENCOUNTER — Ambulatory Visit
Admission: RE | Admit: 2021-12-20 | Discharge: 2021-12-20 | Disposition: A | Discharge status: Home / Self Care | Payer: BC Managed Care – PPO | Source: Ambulatory Visit | Attending: Gastroenterology | Admitting: Gastroenterology

## 2021-12-20 DIAGNOSIS — R140 Abdominal distension (gaseous): Secondary | ICD-10-CM

## 2021-12-20 DIAGNOSIS — R935 Abnormal findings on diagnostic imaging of other abdominal regions, including retroperitoneum: Secondary | ICD-10-CM

## 2021-12-20 DIAGNOSIS — R7989 Other specified abnormal findings of blood chemistry: Secondary | ICD-10-CM

## 2021-12-20 MED ORDER — GADOBENATE DIMEGLUMINE 529 MG/ML IV SOLN
18.0000 mL | Freq: Once | INTRAVENOUS | Status: AC | PRN
Start: 2021-12-20 — End: 2021-12-20
  Administered 2021-12-20: 18 mL via INTRAVENOUS

## 2022-01-24 ENCOUNTER — Other Ambulatory Visit (HOSPITAL_COMMUNITY): Payer: Self-pay | Admitting: Psychiatry

## 2022-01-24 DIAGNOSIS — F4312 Post-traumatic stress disorder, chronic: Secondary | ICD-10-CM

## 2022-01-27 ENCOUNTER — Other Ambulatory Visit (HOSPITAL_COMMUNITY): Payer: Self-pay | Admitting: Psychiatry

## 2022-01-27 DIAGNOSIS — F4312 Post-traumatic stress disorder, chronic: Secondary | ICD-10-CM

## 2022-01-27 MED ORDER — ALPRAZOLAM 0.5 MG PO TABS: 0.5000 mg | ORAL_TABLET | Freq: Two times a day (BID) | ORAL | 1 refills | Status: DC | PRN

## 2022-01-27 NOTE — Progress Notes (Signed)
Xanax refilled.  [name], PMHNP

## 2022-03-05 ENCOUNTER — Ambulatory Visit: Payer: Self-pay | Admitting: Surgery

## 2022-03-05 NOTE — Progress Notes (Signed)
Surgery orders requested via Epic inbox. °

## 2022-03-13 NOTE — Patient Instructions (Signed)
DUE TO COVID-19 ONLY TWO VISITORS  (aged 49 and older)  ARE ALLOWED TO COME WITH YOU AND STAY IN THE WAITING ROOM ONLY DURING PRE OP AND PROCEDURE.   **NO VISITORS ARE ALLOWED IN THE SHORT STAY AREA OR RECOVERY ROOM!!**  IF YOU WILL BE ADMITTED INTO THE HOSPITAL YOU ARE ALLOWED ONLY FOUR SUPPORT PEOPLE DURING VISITATION HOURS ONLY (7 AM -8PM)   The support person(s) must pass our screening, gel in and out, and wear a mask at all times, including in the patient's room. Patients must also wear a mask when staff or their support person are in the room. Visitors GUEST BADGE MUST BE WORN VISIBLY  One adult visitor may remain with you overnight and MUST be in the room by 8 P.M.     Your procedure is scheduled on: 03/19/22   Report to [facility] Main Entrance    Report to admitting at: 5:15 AM   Call this number if you have problems the morning of surgery [phone]   Eat a light diet the day before surgery.  Examples including soups, broths, toast, yogurt, mashed potatoes.  Things to avoid include carbonated beverages (fizzy beverages), raw fruits and raw vegetables, or beans.   If your bowels are filled with gas, your surgeon will have difficulty visualizing your pelvic organs which increases your surgical risks. Do not eat food :After Midnight.   After Midnight you may have the following liquids until : 4:30 AM DAY OF SURGERY  Water Black Coffee (sugar ok, NO MILK/CREAM OR CREAMERS)  Tea (sugar ok, NO MILK/CREAM OR CREAMERS) regular and decaf                             Plain Jell-O (NO RED)                                           Fruit ices (not with fruit pulp, NO RED)                                     Popsicles (NO RED)                                                                  Juice: apple, WHITE grape, WHITE cranberry Sports drinks like Gatorade (NO RED)    Oral Hygiene is also important to reduce your risk of infection.                                     Remember - BRUSH YOUR TEETH THE MORNING OF SURGERY WITH YOUR REGULAR TOOTHPASTE   Do NOT smoke after Midnight   Take these medicines the morning of surgery with A SIP OF WATER: fluoxetine.Alprazolam as needed.                               You may not have any metal on your  body including hair pins, jewelry, and body piercing             Do not wear lotions, powders, perfumes/cologne, or deodorant              Men may shave face and neck.   Do not bring valuables to the hospital. [place].   Contacts, dentures or bridgework may not be worn into surgery.   Bring small overnight bag day of surgery.   DO NOT [place]. PHARMACY WILL DISPENSE MEDICATIONS LISTED ON YOUR MEDICATION LIST TO YOU DURING YOUR ADMISSION [place]!    Patients discharged on the day of surgery will not be allowed to drive home.  Someone NEEDS to stay with you for the first 24 hours after anesthesia.   Special Instructions: Bring a copy of your healthcare power of attorney and living will documents         the day of surgery if you haven't scanned them before.              Please read over the following fact sheets you were given: IF YOU HAVE QUESTIONS ABOUT YOUR PRE-OP INSTRUCTIONS PLEASE CALL [phone]    [facility] - Preparing for Surgery Before surgery, you can play an important role.  Because skin is not sterile, your skin needs to be as free of germs as possible.  You can reduce the number of germs on your skin by washing with CHG (chlorahexidine gluconate) soap before surgery.  CHG is an antiseptic cleaner which kills germs and bonds with the skin to continue killing germs even after washing. Please DO NOT use if you have an allergy to CHG or antibacterial soaps.  If your skin becomes reddened/irritated stop using the CHG and inform your nurse when you arrive at Short Stay. Do not shave (including legs and  underarms) for at least 48 hours prior to the first CHG shower.  You may shave your face/neck. Please follow these instructions carefully:  1.  Shower with CHG Soap the night before surgery and the  morning of Surgery.  2.  If you choose to wash your hair, wash your hair first as usual with your  normal  shampoo.  3.  After you shampoo, rinse your hair and body thoroughly to remove the  shampoo.                           4.  Use CHG as you would any other liquid soap.  You can apply chg directly  to the skin and wash                       Gently with a scrungie or clean washcloth.  5.  Apply the CHG Soap to your body ONLY FROM THE NECK DOWN.   Do not use on face/ open                           Wound or open sores. Avoid contact with eyes, ears mouth and genitals (private parts).                       Wash face,  Genitals (private parts) with your normal soap.  6.  Wash thoroughly, paying special attention to the area where your surgery  will be performed.  7.  Thoroughly rinse your body with warm water from the neck down.  8.  DO NOT shower/wash with your normal soap after using and rinsing off  the CHG Soap.                9.  Pat yourself dry with a clean towel.            10.  Wear clean pajamas.            11.  Place clean sheets on your bed the night of your first shower and do not  sleep with pets. Day of Surgery : Do not apply any lotions/deodorants the morning of surgery.  Please wear clean clothes to the hospital/surgery center.  FAILURE TO FOLLOW THESE INSTRUCTIONS MAY RESULT IN THE CANCELLATION OF YOUR SURGERY PATIENT SIGNATURE_________________________________  NURSE SIGNATURE__________________________________  ________________________________________________________________________

## 2022-03-14 ENCOUNTER — Encounter (HOSPITAL_COMMUNITY): Payer: Self-pay

## 2022-03-14 ENCOUNTER — Encounter (HOSPITAL_COMMUNITY)
Admission: RE | Admit: 2022-03-14 | Discharge: 2022-03-14 | Disposition: A | Discharge status: Home / Self Care | Payer: BC Managed Care – PPO | Source: Ambulatory Visit | Attending: Surgery | Admitting: Surgery

## 2022-03-14 ENCOUNTER — Other Ambulatory Visit: Payer: Self-pay

## 2022-03-14 VITALS — BP 131/90 | HR 84 | Temp 97.7°F | Ht 74.0 in | Wt 188.0 lb

## 2022-03-14 DIAGNOSIS — Z01812 Encounter for preprocedural laboratory examination: Secondary | ICD-10-CM | POA: Diagnosis present

## 2022-03-14 DIAGNOSIS — Z01818 Encounter for other preprocedural examination: Secondary | ICD-10-CM

## 2022-03-14 HISTORY — DX: Anxiety disorder, unspecified: F41.9

## 2022-03-14 LAB — CBC
HCT: 37.4 % — ABNORMAL LOW (ref 39.0–52.0)
Hemoglobin: 13 g/dL (ref 13.0–17.0)
MCH: 39.8 pg — ABNORMAL HIGH (ref 26.0–34.0)
MCHC: 34.8 g/dL (ref 30.0–36.0)
MCV: 114.4 fL — ABNORMAL HIGH (ref 80.0–100.0)
Platelets: 78 10*3/uL — ABNORMAL LOW (ref 150–400)
RBC: 3.27 MIL/uL — ABNORMAL LOW (ref 4.22–5.81)
RDW: 14.6 % (ref 11.5–15.5)
WBC: 4.2 10*3/uL (ref 4.0–10.5)
nRBC: 0 % (ref 0.0–0.2)

## 2022-03-14 NOTE — Progress Notes (Addendum)
For Short Stay: Day appointment date:  Bowel Prep reminder:   For Anesthesia: PCP [facility] Physician: Lady Gary. Cardiologist -   Chest x-ray -  EKG -  Stress Test -  ECHO -  Cardiac Cath -  Pacemaker/ICD device last checked: Pacemaker orders received: Device Rep notified:  Spinal Cord Stimulator:  Sleep Study -  CPAP -   Fasting Blood Sugar -  Checks Blood Sugar _____ times a day Date and result of last Hgb A1c-  Last dose of GLP1 agonist-  GLP1 instructions:   Last dose of SGLT-2 inhibitors-  SGLT-2 instructions:   Blood Thinner Instructions: Aspirin Instructions: Last Dose:  Activity level: Can go up a flight of stairs and activities of daily living without stopping and without chest pain and/or shortness of breath   Able to exercise without chest pain and/or shortness of breath   Unable to go up a flight of stairs without chest pain and/or shortness of breath     Anesthesia review:   Patient denies shortness of breath, fever, cough and chest pain at PAT appointment   Patient verbalized understanding of instructions that were given to them at the PAT appointment. Patient was also instructed that they will need to review over the PAT instructions again at home before surgery.

## 2022-03-14 NOTE — Progress Notes (Signed)
Lab. Results: Platelets: 78

## 2022-03-19 ENCOUNTER — Ambulatory Visit (HOSPITAL_COMMUNITY)
Admission: RE | Admit: 2022-03-19 | Discharge: 2022-03-19 | Disposition: A | Discharge status: Home / Self Care | Payer: BC Managed Care – PPO | Source: Ambulatory Visit | Attending: Surgery | Admitting: Surgery

## 2022-03-19 ENCOUNTER — Ambulatory Visit (HOSPITAL_COMMUNITY): Payer: BC Managed Care – PPO | Admitting: Certified Registered"

## 2022-03-19 ENCOUNTER — Encounter (HOSPITAL_COMMUNITY)
Admission: RE | Disposition: A | Discharge status: Home / Self Care | Payer: Self-pay | Source: Ambulatory Visit | Attending: Surgery

## 2022-03-19 ENCOUNTER — Ambulatory Visit (HOSPITAL_COMMUNITY): Payer: BC Managed Care – PPO

## 2022-03-19 ENCOUNTER — Other Ambulatory Visit: Payer: Self-pay

## 2022-03-19 ENCOUNTER — Encounter (HOSPITAL_COMMUNITY): Payer: Self-pay | Admitting: Surgery

## 2022-03-19 DIAGNOSIS — K7460 Unspecified cirrhosis of liver: Secondary | ICD-10-CM | POA: Insufficient documentation

## 2022-03-19 DIAGNOSIS — F17210 Nicotine dependence, cigarettes, uncomplicated: Secondary | ICD-10-CM | POA: Insufficient documentation

## 2022-03-19 DIAGNOSIS — K811 Chronic cholecystitis: Secondary | ICD-10-CM | POA: Diagnosis present

## 2022-03-19 HISTORY — PX: CHOLECYSTECTOMY: SHX55

## 2022-03-19 LAB — HEPATIC FUNCTION PANEL
ALT: 82 U/L — ABNORMAL HIGH (ref 0–44)
AST: 134 U/L — ABNORMAL HIGH (ref 15–41)
Albumin: 3.5 g/dL (ref 3.5–5.0)
Alkaline Phosphatase: 161 U/L — ABNORMAL HIGH (ref 38–126)
Bilirubin, Direct: 0.1 mg/dL (ref 0.0–0.2)
Indirect Bilirubin: 0.6 mg/dL (ref 0.3–0.9)
Total Bilirubin: 0.7 mg/dL (ref 0.3–1.2)
Total Protein: 6.1 g/dL — ABNORMAL LOW (ref 6.5–8.1)

## 2022-03-19 LAB — PROTIME-INR
INR: 1.3 — ABNORMAL HIGH (ref 0.8–1.2)
Prothrombin Time: 15.9 seconds — ABNORMAL HIGH (ref 11.4–15.2)

## 2022-03-19 SURGERY — LAPAROSCOPIC CHOLECYSTECTOMY WITH INTRAOPERATIVE CHOLANGIOGRAM
Anesthesia: General

## 2022-03-19 MED ORDER — CHLORHEXIDINE GLUCONATE CLOTH 2 % EX PADS: 6.0000 | MEDICATED_PAD | Freq: Once | CUTANEOUS | Status: DC

## 2022-03-19 MED ORDER — PROPOFOL 10 MG/ML IV BOLUS
INTRAVENOUS | Status: DC | PRN
  Administered 2022-03-19: 200 mg via INTRAVENOUS

## 2022-03-19 MED ORDER — DEXAMETHASONE SODIUM PHOSPHATE 10 MG/ML IJ SOLN
INTRAMUSCULAR | Status: AC
  Filled 2022-03-19: qty 1

## 2022-03-19 MED ORDER — OXYCODONE-ACETAMINOPHEN 5-325 MG PO TABS: 1.0000 | ORAL_TABLET | ORAL | 0 refills | Status: DC | PRN

## 2022-03-19 MED ORDER — BUPIVACAINE-EPINEPHRINE (PF) 0.5% -1:200000 IJ SOLN
INTRAMUSCULAR | Status: DC | PRN
  Administered 2022-03-19: 20 mL

## 2022-03-19 MED ORDER — ONDANSETRON HCL 4 MG/2ML IJ SOLN
INTRAMUSCULAR | Status: DC | PRN
  Administered 2022-03-19: 4 mg via INTRAVENOUS

## 2022-03-19 MED ORDER — FENTANYL CITRATE (PF) 250 MCG/5ML IJ SOLN
INTRAMUSCULAR | Status: AC
  Filled 2022-03-19: qty 5

## 2022-03-19 MED ORDER — ACETAMINOPHEN 500 MG PO TABS
1000.0000 mg | ORAL_TABLET | ORAL | Status: DC
  Filled 2022-03-19: qty 2

## 2022-03-19 MED ORDER — PROPOFOL 10 MG/ML IV BOLUS
INTRAVENOUS | Status: AC
  Filled 2022-03-19: qty 20

## 2022-03-19 MED ORDER — ROCURONIUM BROMIDE 10 MG/ML (PF) SYRINGE
PREFILLED_SYRINGE | INTRAVENOUS | Status: AC
  Filled 2022-03-19: qty 10

## 2022-03-19 MED ORDER — HYDROMORPHONE HCL 1 MG/ML IJ SOLN
INTRAMUSCULAR | Status: AC
  Filled 2022-03-19: qty 1

## 2022-03-19 MED ORDER — OXYCODONE HCL 5 MG PO TABS
ORAL_TABLET | ORAL | Status: AC
  Filled 2022-03-19: qty 1

## 2022-03-19 MED ORDER — KETOROLAC TROMETHAMINE 30 MG/ML IJ SOLN: 30.0000 mg | Freq: Once | INTRAMUSCULAR | Status: DC | PRN

## 2022-03-19 MED ORDER — ORAL CARE MOUTH RINSE: 15.0000 mL | Freq: Once | OROMUCOSAL | Status: AC

## 2022-03-19 MED ORDER — MIDAZOLAM HCL 5 MG/5ML IJ SOLN
INTRAMUSCULAR | Status: DC | PRN
  Administered 2022-03-19: 2 mg via INTRAVENOUS

## 2022-03-19 MED ORDER — ONDANSETRON HCL 4 MG/2ML IJ SOLN: 4.0000 mg | Freq: Once | INTRAMUSCULAR | Status: DC | PRN

## 2022-03-19 MED ORDER — ROCURONIUM BROMIDE 10 MG/ML (PF) SYRINGE
PREFILLED_SYRINGE | INTRAVENOUS | Status: DC | PRN
  Administered 2022-03-19: 70 mg via INTRAVENOUS

## 2022-03-19 MED ORDER — SUCCINYLCHOLINE CHLORIDE 200 MG/10ML IV SOSY
PREFILLED_SYRINGE | INTRAVENOUS | Status: AC
  Filled 2022-03-19: qty 10

## 2022-03-19 MED ORDER — 0.9 % SODIUM CHLORIDE (POUR BTL) OPTIME
TOPICAL | Status: DC | PRN
  Administered 2022-03-19: 1000 mL

## 2022-03-19 MED ORDER — CEFAZOLIN SODIUM-DEXTROSE 2-4 GM/100ML-% IV SOLN
2.0000 g | INTRAVENOUS | Status: AC
  Administered 2022-03-19: 2 g via INTRAVENOUS
  Filled 2022-03-19: qty 100

## 2022-03-19 MED ORDER — LACTATED RINGERS IV SOLN: INTRAVENOUS | Status: DC

## 2022-03-19 MED ORDER — KETAMINE HCL 50 MG/5ML IJ SOSY
PREFILLED_SYRINGE | INTRAMUSCULAR | Status: AC
  Filled 2022-03-19: qty 5

## 2022-03-19 MED ORDER — CHLORHEXIDINE GLUCONATE 0.12 % MT SOLN
15.0000 mL | Freq: Once | OROMUCOSAL | Status: AC
  Administered 2022-03-19: 15 mL via OROMUCOSAL

## 2022-03-19 MED ORDER — FENTANYL CITRATE (PF) 100 MCG/2ML IJ SOLN
INTRAMUSCULAR | Status: DC | PRN
  Administered 2022-03-19: 100 ug via INTRAVENOUS

## 2022-03-19 MED ORDER — DEXAMETHASONE SODIUM PHOSPHATE 10 MG/ML IJ SOLN
INTRAMUSCULAR | Status: DC | PRN
  Administered 2022-03-19: 5 mg via INTRAVENOUS

## 2022-03-19 MED ORDER — MIDAZOLAM HCL 2 MG/2ML IJ SOLN
INTRAMUSCULAR | Status: AC
  Filled 2022-03-19: qty 2

## 2022-03-19 MED ORDER — KETAMINE HCL 10 MG/ML IJ SOLN
INTRAMUSCULAR | Status: DC | PRN
  Administered 2022-03-19: 25 mg via INTRAVENOUS

## 2022-03-19 MED ORDER — LIDOCAINE 2% (20 MG/ML) 5 ML SYRINGE
INTRAMUSCULAR | Status: DC | PRN
  Administered 2022-03-19: 80 mg via INTRAVENOUS

## 2022-03-19 MED ORDER — SUGAMMADEX SODIUM 200 MG/2ML IV SOLN
INTRAVENOUS | Status: DC | PRN
  Administered 2022-03-19: 200 mg via INTRAVENOUS

## 2022-03-19 MED ORDER — HEMOSTATIC AGENTS (NO CHARGE) OPTIME
TOPICAL | Status: DC | PRN
  Administered 2022-03-19: 1 via TOPICAL

## 2022-03-19 MED ORDER — GLYCOPYRROLATE 0.2 MG/ML IJ SOLN
INTRAMUSCULAR | Status: DC | PRN
  Administered 2022-03-19: .2 mg via INTRAVENOUS

## 2022-03-19 MED ORDER — HYDROMORPHONE HCL 1 MG/ML IJ SOLN
0.2500 mg | INTRAMUSCULAR | Status: DC | PRN
  Administered 2022-03-19: 0.5 mg via INTRAVENOUS

## 2022-03-19 MED ORDER — BUPIVACAINE LIPOSOME 1.3 % IJ SUSP: 20.0000 mL | Freq: Once | INTRAMUSCULAR | Status: DC

## 2022-03-19 MED ORDER — OXYCODONE HCL 5 MG PO TABS
5.0000 mg | ORAL_TABLET | Freq: Once | ORAL | Status: AC | PRN
  Administered 2022-03-19: 5 mg via ORAL

## 2022-03-19 MED ORDER — BUPIVACAINE-EPINEPHRINE (PF) 0.5% -1:200000 IJ SOLN
INTRAMUSCULAR | Status: AC
  Filled 2022-03-19: qty 30

## 2022-03-19 MED ORDER — KETOROLAC TROMETHAMINE 15 MG/ML IJ SOLN: 15.0000 mg | INTRAMUSCULAR | Status: DC

## 2022-03-19 MED ORDER — OXYCODONE HCL 5 MG/5ML PO SOLN: 5.0000 mg | Freq: Once | ORAL | Status: AC | PRN

## 2022-03-19 MED ORDER — GABAPENTIN 300 MG PO CAPS
300.0000 mg | ORAL_CAPSULE | ORAL | Status: DC
  Filled 2022-03-19: qty 1

## 2022-03-19 MED ORDER — ONDANSETRON HCL 4 MG/2ML IJ SOLN
INTRAMUSCULAR | Status: AC
  Filled 2022-03-19: qty 2

## 2022-03-19 MED ORDER — LACTATED RINGERS IR SOLN
Status: DC | PRN
  Administered 2022-03-19: 1000 mL

## 2022-03-19 SURGICAL SUPPLY — 45 items
ADH SKN CLS APL DERMABOND .7 (GAUZE/BANDAGES/DRESSINGS) ×1
APL PRP STRL LF DISP 70% ISPRP (MISCELLANEOUS) ×1
APPLIER CLIP ROT 10 11.4 M/L (STAPLE) ×1
APR CLP MED LRG 11.4X10 (STAPLE) ×1
BAG COUNTER SPONGE SURGICOUNT (BAG) IMPLANT
BAG SPEC RTRVL 10 TROC 200 (ENDOMECHANICALS) ×1
BAG SPNG CNTER NS LX DISP (BAG)
CABLE HIGH FREQUENCY MONO STRZ (ELECTRODE) ×2 IMPLANT
CATH URETL OPEN 5X70 (CATHETERS) IMPLANT
CHLORAPREP W/TINT 26 (MISCELLANEOUS) ×2 IMPLANT
CLIP APPLIE ROT 10 11.4 M/L (STAPLE) ×2 IMPLANT
COVER MAYO STAND XLG (MISCELLANEOUS) ×2 IMPLANT
COVER SURGICAL LIGHT HANDLE (MISCELLANEOUS) ×2 IMPLANT
DERMABOND ADVANCED .7 DNX12 (GAUZE/BANDAGES/DRESSINGS) ×2 IMPLANT
DRAPE C-ARM 42X120 X-RAY (DRAPES) IMPLANT
ELECT REM PT RETURN 15FT ADLT (MISCELLANEOUS) ×2 IMPLANT
ENDOLOOP SUT PDS II  0 18 (SUTURE) ×1
ENDOLOOP SUT PDS II 0 18 (SUTURE) ×2 IMPLANT
GLOVE BIO SURGEON STRL SZ7.5 (GLOVE) ×2 IMPLANT
GLOVE BIOGEL PI IND STRL 8 (GLOVE) ×2 IMPLANT
GOWN STRL REUS W/ TWL XL LVL3 (GOWN DISPOSABLE) ×4 IMPLANT
GOWN STRL REUS W/TWL XL LVL3 (GOWN DISPOSABLE) ×2
GRASPER SUT TROCAR 14GX15 (MISCELLANEOUS) IMPLANT
HEMOSTAT SNOW SURGICEL 2X4 (HEMOSTASIS) IMPLANT
IRRIG SUCT STRYKERFLOW 2 WTIP (MISCELLANEOUS) ×1
IRRIGATION SUCT STRKRFLW 2 WTP (MISCELLANEOUS) ×2 IMPLANT
IV CATH 14GX2 1/4 (CATHETERS) ×2 IMPLANT
KIT BASIN OR (CUSTOM PROCEDURE TRAY) ×2 IMPLANT
KIT TURNOVER KIT A (KITS) IMPLANT
NDL INSUFFLATION 14GA 120MM (NEEDLE) ×2 IMPLANT
NEEDLE INSUFFLATION 14GA 120MM (NEEDLE) ×1 IMPLANT
PENCIL SMOKE EVACUATOR (MISCELLANEOUS) IMPLANT
POUCH RETRIEVAL ECOSAC 10 (ENDOMECHANICALS) ×2 IMPLANT
POUCH RETRIEVAL ECOSAC 10MM (ENDOMECHANICALS) ×1
SCISSORS LAP 5X35 DISP (ENDOMECHANICALS) ×2 IMPLANT
SET TUBE SMOKE EVAC HIGH FLOW (TUBING) ×2 IMPLANT
SLEEVE Z-THREAD 5X100MM (TROCAR) ×4 IMPLANT
SPIKE FLUID TRANSFER (MISCELLANEOUS) ×2 IMPLANT
STOPCOCK 4 WAY LG BORE MALE ST (IV SETS) IMPLANT
SUT MNCRL AB 4-0 PS2 18 (SUTURE) ×2 IMPLANT
TOWEL OR 17X26 10 PK STRL BLUE (TOWEL DISPOSABLE) ×2 IMPLANT
TOWEL OR NON WOVEN STRL DISP B (DISPOSABLE) IMPLANT
TRAY LAPAROSCOPIC (CUSTOM PROCEDURE TRAY) ×2 IMPLANT
TROCAR ADV FIXATION 12X100MM (TROCAR) ×2 IMPLANT
TROCAR Z-THREAD OPTICAL 5X100M (TROCAR) ×2 IMPLANT

## 2022-03-19 NOTE — Anesthesia Postprocedure Evaluation (Signed)
Anesthesia Post Note  Patient: Lance Matthews  Procedure(s) Performed: LAPAROSCOPIC CHOLECYSTECTOMY     Patient location during evaluation: PACU Anesthesia Type: General Level of consciousness: awake and alert Pain management: pain level controlled Vital Signs Assessment: post-procedure vital signs reviewed and stable Respiratory status: spontaneous breathing, nonlabored ventilation, respiratory function stable and patient connected to nasal cannula oxygen Cardiovascular status: blood pressure returned to baseline and stable Postop Assessment: no apparent nausea or vomiting Anesthetic complications: no  No notable events documented.  Last Vitals:  Vitals:   03/19/22 1000 03/19/22 1015  BP: (!) 153/100 (!) 149/91  Pulse: 84 85  Resp: 16 17  Temp:    SpO2: 99% 94%    Last Pain:  Vitals:   03/19/22 1015  TempSrc:   PainSc: 4                  Maryalyce Sanjuan S

## 2022-03-19 NOTE — Op Note (Signed)
Patient: Lance Matthews (Sep 22, 1970, [id number])  Date of Surgery: 03/19/2022   Preoperative Diagnosis: CHOLELITHIASIS   Postoperative Diagnosis: CHOLELITHIASIS   Surgical Procedure: LAPAROSCOPIC CHOLECYSTECTOMY: 09326 (CPT)   Operative Team Members:  Surgeon(s) and Role:    * Lance Matthews, Lance Major, MD - Primary   Anesthesiologist: Lance Soman, MD CRNA: Lance Daub, CRNA; Matthews, Kristopher, CRNA   Anesthesia: General   Fluids:  Total I/O In: 100 [IV [id number]] Out: -   Complications: None  Drains:  none   Specimen:  ID Type Source Tests Collected by Time Destination  1 : gallbladder Tissue PATH Other SURGICAL PATHOLOGY Lance Matthews, Lance Major, MD 03/19/2022 [phone]      Disposition:  PACU - hemodynamically stable.  Plan of Care: Discharge to home after PACU    Indications for Procedure: Lance Matthews is a 51 y.o. male who presented with abdominal pain.  Workup suggested cholecystitis so laparoscopic cholecystectomy was recommended for the patient.  The procedure itself as well as its risks, benefits and alternatives were discussed.  The risks discussed included but were not limited to the risk of infection, bleeding, damage to nearby structures, and need for additional surgery or operations.  After a full discussion and all questions answered the patient granted consent to proceed.  Findings: Hepatomegaly with firm cirrhotic appearing liver.  Liver edge palpable with abdomen paralyzed.  Inflamed gallbladder   Description of Procedure:   On the date stated above, the patient was taken to the operating room suite and placed in supine positioning.  Sequential compression devices were placed on the lower extremities to prevent blood clots.  General endotracheal anesthesia was induced. Preoperative antibiotics were given.  The patient's abdomen was prepped and draped in the usual sterile fashion.  A time-out was completed verifying the correct patient,  procedure, positioning and equipment needed for the case.  The liver edge was palpable with the patient's abdomen paralyzed.  Liver felt firm and enlarged.    We began by anesthetizing the skin with local anesthetic and then making a 5 mm incision just below the umbilicus.  We dissected through the subcutaneous tissues to the fascia.  The fascia was grasped and elevated using a Kocher clamp.  A Veress needle was inserted into the abdomen and the abdomen was insufflated to 15 mmHg.  A 5 mm trocar was inserted in this position under optical guidance and then the abdomen was inspected.  There was no trauma to the underlying viscera with initial trocar placement.  Any abnormal findings, other than inflammation in the right upper quadrant, are listed above in the findings section.  Three additional trocars were placed, one 12 mm trocar in the subxiphoid position, one 5 mm trocar in the midline epigastric area and one 63m trocar in the right upper quadrant subcostally.  These were placed under direct vision without any trauma to the underlying viscera.    The liver was firm and enlarged consistent with cirrhosis.  The patient was then placed in head up, left side down positioning.  The gallbladder was identified and dissected free from its attachments to the omentum allowing the duodenum to fall away.  The infundibulum of the gallbladder was dissected free working laterally to medially.  The cystic duct and cystic artery were dissected free from surrounding connective tissue.  The infundibulum of the gallbladder was dissected off the cystic plate.  A critical view of safety was obtained with the cystic duct and cystic artery being cleared of connective tissues and clearly  the only two structures entering into the gallbladder with the liver clearly visible behind.  Clips were then applied to the cystic duct and cystic artery and then these structures were divided.  The gallbladder was dissected off the cystic  plate, placed in an endocatch bag and removed from the 12 mm subxiphoid port site.  The clips were inspected and appeared effective.  The cystic plate was inspected and hemostasis was obtained using electrocautery. Lance Matthews was placed in the gallbladder fossa to ensure hemostasis in this cirrhotic patient.  A suction irrigator was used to clean the operative field.  Attention was turned to closure.  The 12 mm subxiphoid port site was closed using a 0-vicryl suture on a fascial suture passer.  The abdomen was desufflated.  The skin was closed using 4-0 monocryl and dermabond.  All sponge and needle counts were correct at the conclusion of the case.    Lance Raw, MD General, Bariatric, & Minimally Invasive Surgery [facility], [place]

## 2022-03-19 NOTE — Anesthesia Procedure Notes (Signed)
Procedure Name: Intubation Date/Time: 03/19/2022 8:20 AM  Performed by: Cleda Daub, CRNAPre-anesthesia Checklist: Patient identified, Emergency Drugs available, Suction available and Patient being monitored Patient Re-evaluated:Patient Re-evaluated prior to induction Oxygen Delivery Method: Circle system utilized Preoxygenation: Pre-oxygenation with 100% oxygen Induction Type: IV induction Ventilation: Mask ventilation without difficulty, Oral airway inserted - appropriate to patient size and Two handed mask ventilation required Laryngoscope Size: Mac and 3 Grade View: Grade II Tube type: Oral Tube size: 7.5 mm Number of attempts: 1 Airway Equipment and Method: Stylet and Oral airway Placement Confirmation: ETT inserted through vocal cords under direct vision, positive ETCO2 and breath sounds checked- equal and bilateral Secured at: 23 cm Tube secured with: Tape Dental Injury: Teeth and Oropharynx as per pre-operative assessment

## 2022-03-19 NOTE — Transfer of Care (Signed)
Immediate Anesthesia Transfer of Care Note  Patient: Lance Matthews  Procedure(s) Performed: LAPAROSCOPIC CHOLECYSTECTOMY  Patient Location: PACU  Anesthesia Type:General  Level of Consciousness: awake, alert , oriented, and patient cooperative  Airway & Oxygen Therapy: Patient Spontanous Breathing and Patient connected to face mask oxygen  Post-op Assessment: Report given to RN and Post -op Vital signs reviewed and stable  Post vital signs: Reviewed and stable  Last Vitals:  Vitals Value Taken Time  BP 158/89 03/19/22 0942  Temp    Pulse 80 03/19/22 0944  Resp 18 03/19/22 0944  SpO2 100 % 03/19/22 0944  Vitals shown include unvalidated device data.  Last Pain:  Vitals:   03/19/22 0556  TempSrc: Oral  PainSc:       Patients Stated Pain Goal: 4 (30/14/99 6924)  Complications: No notable events documented.

## 2022-03-19 NOTE — Discharge Instructions (Signed)
 CHOLECYSTECTOMY POST OPERATIVE INSTRUCTIONS  Thinking Clearly  The anesthesia may cause you to feel different for 1 or 2 days. Do not drive, drink alcohol, or make any big decisions for at least 2 days.  Nutrition When you wake up, you will be able to drink small amounts of liquid. If you do not feel sick, you can slowly advance your diet to regular foods. Continue to drink lots of fluids, usually about 8 to 10 glasses per day. Eat a high-fiber diet so you don't strain during bowel movements. High-Fiber Foods Foods high in fiber include beans, bran cereals and whole-grain breads, peas, dried fruit (figs, apricots, and dates), raspberries, blackberries, strawberries, sweet corn, broccoli, baked potatoes with skin, plums, pears, apples, greens, and nuts. Activity Slowly increase your activity. Be sure to get up and walk every hour or so to prevent blood clots. No heavy lifting or strenuous activity for 4 weeks following surgery to prevent hernias at your incision sites It is normal to feel tired. You may need more sleep than usual.  Get your rest but make sure to get up and move around frequently to prevent blood clots and pneumonia.  Work and Return to School You can go back to work when you feel well enough. Discuss the timing with your surgeon. You can usually go back to school or work 1 week after an operation. If your work requires heavy lifting or strenuous activity you need to be placed on light duty for 4 weeks following surgery. You can return to gym class, sports or other physical activities 4 weeks after surgery.  Wound Care Always wash your hands before and after touching near your incision site. Do not soak in a bathtub until cleared at your follow up appointment. You may take a shower 24 hours after surgery. A small amount of drainage from the incision is normal. If the drainage is thick and yellow or the site is red, you may have an infection, so call your surgeon. If you  have a drain in one of your incisions, it will be taken out in office when the drainage stops. Steri-Strips will fall off in 7 to 10 days or they will be removed during your first office visit. If you have dermabond glue covering over the incision, allow the glue to flake off on its own. Avoid wearing tight or rough clothing. It may rub your incisions and make it harder for them to heal. Protect the new skin, especially from the sun. The sun can burn and cause darker scarring. Your scar will heal in about 4 to 6 weeks and will become softer and continue to fade over the next year.  The cosmetic appearance of the incisions will improve over the course of the first year after surgery. Sensation around your incision will return in a few weeks or months.  Bowel Movements After intestinal surgery, you may have loose watery stools for several days. If watery diarrhea lasts longer than 3 days, contact your surgeon. Pain medication (narcotics) can cause constipation. Increase the fiber in your diet with high-fiber foods if you are constipated. You can take an over the counter stool softener like Colace to avoid constipation.  Additional over the counter medications can also be used if Colace isn't sufficient (for example, Milk of Magnesia or Miralax).  Pain The amount of pain is different for each person. Some people need only 1 to 3 doses of pain control medication, while others need more. Take alternating doses of tylenol   and ibuprofen around the clock for the first five days following surgery.  This will provide a baseline of pain control and help with inflammation.  Take the narcotic pain medication in addition if needed for severe pain.  Contact Your Surgeon at [phone], if you have: Pain in your right upper abdomen like a gallbladder attack. Pain that will not go away Pain that gets worse A fever of more than 101F (38.3C) Repeated vomiting Swelling, redness, bleeding, or bad-smelling  drainage from your wound site Strong abdominal pain No bowel movement or unable to pass gas for 3 days Watery diarrhea lasting longer than 3 days  Pain Control The goal of pain control is to minimize pain, keep you moving and help you heal. Your surgical team will work with you on your pain plan. Most often a combination of therapies and medications are used to control your pain. You may also be given medication (local anesthetic) at the surgical site. This may help control your pain for several days. Extreme pain puts extra stress on your body at a time when your body needs to focus on healing. Do not wait until your pain has reached a level "10" or is unbearable before telling your doctor or nurse. It is much easier to control pain before it becomes severe. Following a laparoscopic procedure, pain is sometimes felt in the shoulder. This is due to the gas inserted into your abdomen during the procedure. Moving and walking helps to decrease the gas and the right shoulder pain.  Use the guide below for ways to manage your post-operative pain. Learn more by going to facs.org/safepaincontrol.  How Intense Is My Pain Common Therapies to Feel Better       I hardly notice my pain, and it does not interfere with my activities.  I notice my pain and it distracts me, but I can still do activities (sitting up, walking, standing).  Non-Medication Therapies  Ice (in a bag, applied over clothing at the surgical site), elevation, rest, meditation, massage, distraction (music, TV, play) walking and mild exercise Splinting the abdomen with pillows +  Non-Opioid Medications Acetaminophen (Tylenol) Non-steroidal anti-inflammatory drugs (NSAIDS) Aspirin, Ibuprofen (Motrin, Advil) Naproxen (Aleve) Take these as needed, when you feel pain. Both acetaminophen and NSAIDs help to decrease pain and swelling (inflammation).      My pain is hard to ignore and is more noticeable even when I rest.  My  pain interferes with my usual activities.  Non-Medication Therapies  +  Non-Opioid medications  Take on a regular schedule (around-the-clock) instead of as needed. (For example, Tylenol every 6 hours at 9:00 am, 3:00 pm, 9:00 pm, 3:00 am and Motrin every 6 hours at 12:00 am, 6:00 am, 12:00 pm, 6:00 pm)         I am focused on my pain, and I am not doing my daily activities.  I am groaning in pain, and I cannot sleep. I am unable to do anything.  My pain is as bad as it could be, and nothing else matters.  Non-Medication Therapies  +  Around-the-Clock Non-Opioid Medications  +  Short-acting opioids  Opioids should be used with other medications to manage severe pain. Opioids block pain and give a feeling of euphoria (feel high). Addiction, a serious side effect of opioids, is rare with short-term (a few days) use.  Examples of short-acting opioids include: Tramadol (Ultram), Hydrocodone (Norco, Vicodin), Hydromorphone (Dilaudid), Oxycodone (Oxycontin)     The above directions have been adapted from   the American College [employer] Surgical Patient Education Program.  Please refer to the ACS website if needed: https://www.facs.org/-/media/files/education/patient-ed/cholesys.ashx.   Joyann Spidle, MD [facility] [address], [address], [place], [place]  [address] ?  [address] [address], [place], [place]   [address] [phone] ? [phone] ? FAX [phone] Web site: [email or web address]  

## 2022-03-19 NOTE — Anesthesia Preprocedure Evaluation (Signed)
Anesthesia Evaluation  Patient identified by MRN, date of birth, ID band Patient awake    Reviewed: Allergy & Precautions, H&P , NPO status , Patient's Chart, lab work & pertinent test results  Airway Mallampati: II  TM Distance: >3 FB Neck ROM: Full    Dental no notable dental hx.    Pulmonary Current Smoker and Patient abstained from smoking.   Pulmonary exam normal breath sounds clear to auscultation       Cardiovascular negative cardio ROS Normal cardiovascular exam Rhythm:Regular Rate:Normal     Neuro/Psych negative neurological ROS  negative psych ROS   GI/Hepatic negative GI ROS,,,(+)     substance abuse  alcohol use  Endo/Other  negative endocrine ROS    Renal/GU negative Renal ROS  negative genitourinary   Musculoskeletal negative musculoskeletal ROS (+)    Abdominal   Peds negative pediatric ROS (+)  Hematology negative hematology ROS (+)   Anesthesia Other Findings   Reproductive/Obstetrics negative OB ROS                             Anesthesia Physical Anesthesia Plan  ASA: 2  Anesthesia Plan: General   Post-op Pain Management: Tylenol PO (pre-op)*   Induction: Intravenous  PONV Risk Score and Plan: 1 and Ondansetron, Dexamethasone and Treatment may vary due to age or medical condition  Airway Management Planned: Oral ETT  Additional Equipment:   Intra-op Plan:   Post-operative Plan: Extubation in OR  Informed Consent: I have reviewed the patients History and Physical, chart, labs and discussed the procedure including the risks, benefits and alternatives for the proposed anesthesia with the patient or authorized representative who has indicated his/her understanding and acceptance.     Dental advisory given  Plan Discussed with: CRNA and Surgeon  Anesthesia Plan Comments:        Anesthesia Quick Evaluation
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
## 2022-03-20 ENCOUNTER — Encounter (HOSPITAL_COMMUNITY): Payer: Self-pay | Admitting: Surgery

## 2022-03-20 LAB — SURGICAL PATHOLOGY

## 2022-03-24 ENCOUNTER — Other Ambulatory Visit: Payer: Self-pay

## 2022-03-24 ENCOUNTER — Inpatient Hospital Stay (HOSPITAL_COMMUNITY)
Admission: EM | Admit: 2022-03-24 | Discharge: 2022-03-25 | DRG: 920 | Disposition: A | Discharge status: Home / Self Care | Payer: BC Managed Care – PPO | Attending: Surgery | Admitting: Surgery

## 2022-03-24 ENCOUNTER — Encounter (HOSPITAL_COMMUNITY): Payer: Self-pay

## 2022-03-24 ENCOUNTER — Emergency Department (HOSPITAL_COMMUNITY): Payer: BC Managed Care – PPO

## 2022-03-24 DIAGNOSIS — F419 Anxiety disorder, unspecified: Secondary | ICD-10-CM | POA: Diagnosis present

## 2022-03-24 DIAGNOSIS — F4312 Post-traumatic stress disorder, chronic: Secondary | ICD-10-CM | POA: Diagnosis present

## 2022-03-24 DIAGNOSIS — Y838 Other surgical procedures as the cause of abnormal reaction of the patient, or of later complication, without mention of misadventure at the time of the procedure: Secondary | ICD-10-CM | POA: Diagnosis present

## 2022-03-24 DIAGNOSIS — L03311 Cellulitis of abdominal wall: Secondary | ICD-10-CM | POA: Diagnosis present

## 2022-03-24 DIAGNOSIS — R58 Hemorrhage, not elsewhere classified: Secondary | ICD-10-CM

## 2022-03-24 DIAGNOSIS — K91870 Postprocedural hematoma of a digestive system organ or structure following a digestive system procedure: Principal | ICD-10-CM | POA: Diagnosis present

## 2022-03-24 DIAGNOSIS — F902 Attention-deficit hyperactivity disorder, combined type: Secondary | ICD-10-CM | POA: Diagnosis present

## 2022-03-24 DIAGNOSIS — Z79899 Other long term (current) drug therapy: Secondary | ICD-10-CM

## 2022-03-24 DIAGNOSIS — F32A Depression, unspecified: Secondary | ICD-10-CM | POA: Diagnosis present

## 2022-03-24 DIAGNOSIS — K7460 Unspecified cirrhosis of liver: Secondary | ICD-10-CM | POA: Diagnosis present

## 2022-03-24 DIAGNOSIS — R188 Other ascites: Secondary | ICD-10-CM | POA: Diagnosis present

## 2022-03-24 DIAGNOSIS — Z9049 Acquired absence of other specified parts of digestive tract: Secondary | ICD-10-CM

## 2022-03-24 DIAGNOSIS — F17210 Nicotine dependence, cigarettes, uncomplicated: Secondary | ICD-10-CM | POA: Diagnosis present

## 2022-03-24 DIAGNOSIS — R748 Abnormal levels of other serum enzymes: Secondary | ICD-10-CM | POA: Diagnosis present

## 2022-03-24 LAB — COMPREHENSIVE METABOLIC PANEL
ALT: 41 U/L (ref 0–44)
AST: 49 U/L — ABNORMAL HIGH (ref 15–41)
Albumin: 3 g/dL — ABNORMAL LOW (ref 3.5–5.0)
Alkaline Phosphatase: 133 U/L — ABNORMAL HIGH (ref 38–126)
Anion gap: 9 (ref 5–15)
BUN: 12 mg/dL (ref 6–20)
CO2: 25 mmol/L (ref 22–32)
Calcium: 8.6 mg/dL — ABNORMAL LOW (ref 8.9–10.3)
Chloride: 96 mmol/L — ABNORMAL LOW (ref 98–111)
Creatinine, Ser: 0.62 mg/dL (ref 0.61–1.24)
GFR, Estimated: >60 mL/min (ref >60)
Glucose, Bld: 98 mg/dL (ref 70–99)
Potassium: 4 mmol/L (ref 3.5–5.1)
Sodium: 130 mmol/L — ABNORMAL LOW (ref 135–145)
Total Bilirubin: 0.9 mg/dL (ref 0.3–1.2)
Total Protein: 6.2 g/dL — ABNORMAL LOW (ref 6.5–8.1)

## 2022-03-24 LAB — CBC WITH DIFFERENTIAL/PLATELET
Abs Immature Granulocytes: 0.04 10*3/uL (ref 0.00–0.07)
Basophils Absolute: 0 10*3/uL (ref 0.0–0.1)
Basophils Relative: 0 %
Eosinophils Absolute: 0.1 10*3/uL (ref 0.0–0.5)
Eosinophils Relative: 1 %
HCT: 35.9 % — ABNORMAL LOW (ref 39.0–52.0)
Hemoglobin: 12.7 g/dL — ABNORMAL LOW (ref 13.0–17.0)
Immature Granulocytes: 1 %
Lymphocytes Relative: 13 %
Lymphs Abs: 1.1 10*3/uL (ref 0.7–4.0)
MCH: 39.9 pg — ABNORMAL HIGH (ref 26.0–34.0)
MCHC: 35.4 g/dL (ref 30.0–36.0)
MCV: 112.9 fL — ABNORMAL HIGH (ref 80.0–100.0)
Monocytes Absolute: 0.6 10*3/uL (ref 0.1–1.0)
Monocytes Relative: 7 %
Neutro Abs: 6.7 10*3/uL (ref 1.7–7.7)
Neutrophils Relative %: 78 %
Platelets: 106 10*3/uL — ABNORMAL LOW (ref 150–400)
RBC: 3.18 MIL/uL — ABNORMAL LOW (ref 4.22–5.81)
RDW: 14.1 % (ref 11.5–15.5)
WBC: 8.5 10*3/uL (ref 4.0–10.5)
nRBC: 0 % (ref 0.0–0.2)

## 2022-03-24 LAB — LIPASE, BLOOD: Lipase: 50 U/L (ref 11–51)

## 2022-03-24 MED ORDER — IOHEXOL 9 MG/ML PO SOLN
250.0000 mL | ORAL | Status: AC
  Administered 2022-03-24: 250 mL via ORAL

## 2022-03-24 MED ORDER — IOHEXOL 300 MG/ML  SOLN
100.0000 mL | Freq: Once | INTRAMUSCULAR | Status: AC | PRN
  Administered 2022-03-24: 100 mL via INTRAVENOUS

## 2022-03-24 NOTE — ED Triage Notes (Signed)
Pt reports swelling and redness on right abdomen had cholecystectomy 12/5. Pt also reports swelling to genital area that started today.

## 2022-03-24 NOTE — ED Provider Triage Note (Signed)
Emergency Medicine Provider Triage Evaluation Note  Lance Matthews , a 51 y.o. male  was evaluated in triage.  Pt complains of abdominal distention, increased abdominal pain as well as abdominal redness.  Patient reports history of laparoscopic cholecystectomy performed on 12/5.  He has noticed increase in symptoms mentioned above since yesterday.  Denies fever, chills, night sweats, chest pain, shortness of breath.  Review of Systems  Positive: See above Negative:   Physical Exam  BP (!) 145/92   Pulse 82   Temp 98.2 F (36.8 C) (Oral)   Resp 16   Ht '6\' 2"'$  (1.88 m)   Wt 85 kg   SpO2 98%   BMI 24.06 kg/m  Gen:   Awake, no distress   Resp:  Normal effort  MSK:   Moves extremities without difficulty  Other:  Erythematous right flank.  Area possibly slightly warmer to the touch than the rest of the abdomen.  Abdominal distention noted.  Tenderness noted in the erythematous region.  Medical Decision Making  Medically screening exam initiated at 4:29 PM.  Appropriate orders placed.  Lance Matthews was informed that the remainder of the evaluation will be completed by another provider, this initial triage assessment does not replace that evaluation, and the importance of remaining in the ED until their evaluation is complete.     Wilnette Kales, [place] 03/24/22 [phone]

## 2022-03-25 ENCOUNTER — Inpatient Hospital Stay (HOSPITAL_COMMUNITY): Payer: BC Managed Care – PPO

## 2022-03-25 ENCOUNTER — Other Ambulatory Visit (HOSPITAL_COMMUNITY): Payer: Self-pay | Admitting: Psychiatry

## 2022-03-25 DIAGNOSIS — K7460 Unspecified cirrhosis of liver: Secondary | ICD-10-CM | POA: Diagnosis present

## 2022-03-25 DIAGNOSIS — F4312 Post-traumatic stress disorder, chronic: Secondary | ICD-10-CM

## 2022-03-25 DIAGNOSIS — Z9049 Acquired absence of other specified parts of digestive tract: Secondary | ICD-10-CM | POA: Diagnosis not present

## 2022-03-25 DIAGNOSIS — R188 Other ascites: Secondary | ICD-10-CM | POA: Diagnosis present

## 2022-03-25 DIAGNOSIS — Z79899 Other long term (current) drug therapy: Secondary | ICD-10-CM | POA: Diagnosis not present

## 2022-03-25 DIAGNOSIS — L03311 Cellulitis of abdominal wall: Secondary | ICD-10-CM | POA: Diagnosis present

## 2022-03-25 DIAGNOSIS — F902 Attention-deficit hyperactivity disorder, combined type: Secondary | ICD-10-CM | POA: Diagnosis present

## 2022-03-25 DIAGNOSIS — K91870 Postprocedural hematoma of a digestive system organ or structure following a digestive system procedure: Secondary | ICD-10-CM | POA: Diagnosis present

## 2022-03-25 DIAGNOSIS — F17210 Nicotine dependence, cigarettes, uncomplicated: Secondary | ICD-10-CM | POA: Diagnosis present

## 2022-03-25 DIAGNOSIS — F419 Anxiety disorder, unspecified: Secondary | ICD-10-CM | POA: Diagnosis present

## 2022-03-25 DIAGNOSIS — Y838 Other surgical procedures as the cause of abnormal reaction of the patient, or of later complication, without mention of misadventure at the time of the procedure: Secondary | ICD-10-CM | POA: Diagnosis present

## 2022-03-25 DIAGNOSIS — F32A Depression, unspecified: Secondary | ICD-10-CM | POA: Diagnosis present

## 2022-03-25 DIAGNOSIS — R748 Abnormal levels of other serum enzymes: Secondary | ICD-10-CM | POA: Diagnosis present

## 2022-03-25 LAB — URINALYSIS, ROUTINE W REFLEX MICROSCOPIC
Bacteria, UA: NONE SEEN
Bilirubin Urine: NEGATIVE
Glucose, UA: NEGATIVE mg/dL
Hgb urine dipstick: NEGATIVE
Ketones, ur: NEGATIVE mg/dL
Leukocytes,Ua: NEGATIVE
Nitrite: NEGATIVE
Protein, ur: 30 mg/dL — AB
Specific Gravity, Urine: 1.01 (ref 1.005–1.030)
pH: 6 (ref 5.0–8.0)

## 2022-03-25 LAB — COMPREHENSIVE METABOLIC PANEL
ALT: 44 U/L (ref 0–44)
AST: 74 U/L — ABNORMAL HIGH (ref 15–41)
Albumin: 2.7 g/dL — ABNORMAL LOW (ref 3.5–5.0)
Alkaline Phosphatase: 155 U/L — ABNORMAL HIGH (ref 38–126)
Anion gap: 8 (ref 5–15)
BUN: 8 mg/dL (ref 6–20)
CO2: 26 mmol/L (ref 22–32)
Calcium: 8.4 mg/dL — ABNORMAL LOW (ref 8.9–10.3)
Chloride: 97 mmol/L — ABNORMAL LOW (ref 98–111)
Creatinine, Ser: 0.72 mg/dL (ref 0.61–1.24)
GFR, Estimated: >60 mL/min (ref >60)
Glucose, Bld: 89 mg/dL (ref 70–99)
Potassium: 3.2 mmol/L — ABNORMAL LOW (ref 3.5–5.1)
Sodium: 131 mmol/L — ABNORMAL LOW (ref 135–145)
Total Bilirubin: 1.2 mg/dL (ref 0.3–1.2)
Total Protein: 5.4 g/dL — ABNORMAL LOW (ref 6.5–8.1)

## 2022-03-25 LAB — CBC
HCT: 32.3 % — ABNORMAL LOW (ref 39.0–52.0)
Hemoglobin: 11.2 g/dL — ABNORMAL LOW (ref 13.0–17.0)
MCH: 39.6 pg — ABNORMAL HIGH (ref 26.0–34.0)
MCHC: 34.7 g/dL (ref 30.0–36.0)
MCV: 114.1 fL — ABNORMAL HIGH (ref 80.0–100.0)
Platelets: 96 10*3/uL — ABNORMAL LOW (ref 150–400)
RBC: 2.83 MIL/uL — ABNORMAL LOW (ref 4.22–5.81)
RDW: 14.6 % (ref 11.5–15.5)
WBC: 5.7 10*3/uL (ref 4.0–10.5)
nRBC: 0 % (ref 0.0–0.2)

## 2022-03-25 LAB — HIV ANTIBODY (ROUTINE TESTING W REFLEX): HIV Screen 4th Generation wRfx: NONREACTIVE

## 2022-03-25 MED ORDER — ALPRAZOLAM 0.25 MG PO TABS: 0.2500 mg | ORAL_TABLET | Freq: Two times a day (BID) | ORAL | Status: DC | PRN

## 2022-03-25 MED ORDER — VANCOMYCIN HCL IN DEXTROSE 1-5 GM/200ML-% IV SOLN
1000.0000 mg | Freq: Once | INTRAVENOUS | Status: AC
  Administered 2022-03-25: 1000 mg via INTRAVENOUS
  Filled 2022-03-25: qty 200

## 2022-03-25 MED ORDER — DIPHENHYDRAMINE HCL 50 MG/ML IJ SOLN: 25.0000 mg | Freq: Four times a day (QID) | INTRAMUSCULAR | Status: DC | PRN

## 2022-03-25 MED ORDER — DIPHENHYDRAMINE HCL 25 MG PO CAPS: 25.0000 mg | ORAL_CAPSULE | Freq: Four times a day (QID) | ORAL | Status: DC | PRN

## 2022-03-25 MED ORDER — ENOXAPARIN SODIUM 40 MG/0.4ML IJ SOSY
40.0000 mg | PREFILLED_SYRINGE | INTRAMUSCULAR | Status: DC
  Administered 2022-03-25: 40 mg via SUBCUTANEOUS
  Filled 2022-03-25: qty 0.4

## 2022-03-25 MED ORDER — ONDANSETRON 4 MG PO TBDP: 4.0000 mg | ORAL_TABLET | Freq: Four times a day (QID) | ORAL | Status: DC | PRN

## 2022-03-25 MED ORDER — TECHNETIUM TC 99M MEBROFENIN IV KIT
5.4500 | PACK | Freq: Once | INTRAVENOUS | Status: AC | PRN
  Administered 2022-03-25: 5.45 via INTRAVENOUS

## 2022-03-25 MED ORDER — PIPERACILLIN-TAZOBACTAM 3.375 G IVPB 30 MIN
3.3750 g | Freq: Once | INTRAVENOUS | Status: AC
  Administered 2022-03-25: 3.375 g via INTRAVENOUS
  Filled 2022-03-25: qty 50

## 2022-03-25 MED ORDER — SODIUM CHLORIDE 0.9 % IV SOLN: INTRAVENOUS | Status: DC

## 2022-03-25 MED ORDER — ACETAMINOPHEN 650 MG RE SUPP: 650.0000 mg | Freq: Four times a day (QID) | RECTAL | Status: DC | PRN

## 2022-03-25 MED ORDER — ONDANSETRON HCL 4 MG/2ML IJ SOLN: 4.0000 mg | Freq: Four times a day (QID) | INTRAMUSCULAR | Status: DC | PRN

## 2022-03-25 MED ORDER — ZOLPIDEM TARTRATE 5 MG PO TABS
10.0000 mg | ORAL_TABLET | Freq: Every evening | ORAL | Status: DC | PRN
  Administered 2022-03-25: 10 mg via ORAL
  Filled 2022-03-25: qty 2

## 2022-03-25 MED ORDER — ACETAMINOPHEN 325 MG PO TABS: 650.0000 mg | ORAL_TABLET | Freq: Four times a day (QID) | ORAL | Status: DC | PRN

## 2022-03-25 MED ORDER — OXYCODONE-ACETAMINOPHEN 5-325 MG PO TABS: 1.0000 | ORAL_TABLET | ORAL | 0 refills | Status: DC | PRN

## 2022-03-25 MED ORDER — RISPERIDONE 0.5 MG PO TABS
1.0000 mg | ORAL_TABLET | Freq: Every day | ORAL | Status: DC
  Administered 2022-03-25: 1 mg via ORAL
  Filled 2022-03-25: qty 2

## 2022-03-25 MED ORDER — FLUOXETINE HCL 20 MG PO CAPS
20.0000 mg | ORAL_CAPSULE | Freq: Every day | ORAL | Status: DC
  Administered 2022-03-25: 20 mg via ORAL
  Filled 2022-03-25: qty 1

## 2022-03-25 MED ORDER — MORPHINE SULFATE (PF) 4 MG/ML IV SOLN
4.0000 mg | INTRAVENOUS | Status: DC | PRN
  Administered 2022-03-25 (×6): 4 mg via INTRAVENOUS
  Filled 2022-03-25 (×6): qty 1
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
## 2022-03-25 NOTE — Discharge Instructions (Signed)
 CHOLECYSTECTOMY POST OPERATIVE INSTRUCTIONS  Thinking Clearly  The anesthesia may cause you to feel different for 1 or 2 days. Do not drive, drink alcohol, or make any big decisions for at least 2 days.  Nutrition When you wake up, you will be able to drink small amounts of liquid. If you do not feel sick, you can slowly advance your diet to regular foods. Continue to drink lots of fluids, usually about 8 to 10 glasses per day. Eat a high-fiber diet so you don't strain during bowel movements. High-Fiber Foods Foods high in fiber include beans, bran cereals and whole-grain breads, peas, dried fruit (figs, apricots, and dates), raspberries, blackberries, strawberries, sweet corn, broccoli, baked potatoes with skin, plums, pears, apples, greens, and nuts. Activity Slowly increase your activity. Be sure to get up and walk every hour or so to prevent blood clots. No heavy lifting or strenuous activity for 4 weeks following surgery to prevent hernias at your incision sites It is normal to feel tired. You may need more sleep than usual.  Get your rest but make sure to get up and move around frequently to prevent blood clots and pneumonia.  Work and Return to School You can go back to work when you feel well enough. Discuss the timing with your surgeon. You can usually go back to school or work 1 week after an operation. If your work requires heavy lifting or strenuous activity you need to be placed on light duty for 4 weeks following surgery. You can return to gym class, sports or other physical activities 4 weeks after surgery.  Wound Care Always wash your hands before and after touching near your incision site. Do not soak in a bathtub until cleared at your follow up appointment. You may take a shower 24 hours after surgery. A small amount of drainage from the incision is normal. If the drainage is thick and yellow or the site is red, you may have an infection, so call your surgeon. If you  have a drain in one of your incisions, it will be taken out in office when the drainage stops. Steri-Strips will fall off in 7 to 10 days or they will be removed during your first office visit. If you have dermabond glue covering over the incision, allow the glue to flake off on its own. Avoid wearing tight or rough clothing. It may rub your incisions and make it harder for them to heal. Protect the new skin, especially from the sun. The sun can burn and cause darker scarring. Your scar will heal in about 4 to 6 weeks and will become softer and continue to fade over the next year.  The cosmetic appearance of the incisions will improve over the course of the first year after surgery. Sensation around your incision will return in a few weeks or months.  Bowel Movements After intestinal surgery, you may have loose watery stools for several days. If watery diarrhea lasts longer than 3 days, contact your surgeon. Pain medication (narcotics) can cause constipation. Increase the fiber in your diet with high-fiber foods if you are constipated. You can take an over the counter stool softener like Colace to avoid constipation.  Additional over the counter medications can also be used if Colace isn't sufficient (for example, Milk of Magnesia or Miralax).  Pain The amount of pain is different for each person. Some people need only 1 to 3 doses of pain control medication, while others need more. Take alternating doses of tylenol   and ibuprofen around the clock for the first five days following surgery.  This will provide a baseline of pain control and help with inflammation.  Take the narcotic pain medication in addition if needed for severe pain.  Contact Your Surgeon at [phone], if you have: Pain in your right upper abdomen like a gallbladder attack. Pain that will not go away Pain that gets worse A fever of more than 101F (38.3C) Repeated vomiting Swelling, redness, bleeding, or bad-smelling  drainage from your wound site Strong abdominal pain No bowel movement or unable to pass gas for 3 days Watery diarrhea lasting longer than 3 days  Pain Control The goal of pain control is to minimize pain, keep you moving and help you heal. Your surgical team will work with you on your pain plan. Most often a combination of therapies and medications are used to control your pain. You may also be given medication (local anesthetic) at the surgical site. This may help control your pain for several days. Extreme pain puts extra stress on your body at a time when your body needs to focus on healing. Do not wait until your pain has reached a level "10" or is unbearable before telling your doctor or nurse. It is much easier to control pain before it becomes severe. Following a laparoscopic procedure, pain is sometimes felt in the shoulder. This is due to the gas inserted into your abdomen during the procedure. Moving and walking helps to decrease the gas and the right shoulder pain.  Use the guide below for ways to manage your post-operative pain. Learn more by going to facs.org/safepaincontrol.  How Intense Is My Pain Common Therapies to Feel Better       I hardly notice my pain, and it does not interfere with my activities.  I notice my pain and it distracts me, but I can still do activities (sitting up, walking, standing).  Non-Medication Therapies  Ice (in a bag, applied over clothing at the surgical site), elevation, rest, meditation, massage, distraction (music, TV, play) walking and mild exercise Splinting the abdomen with pillows +  Non-Opioid Medications Acetaminophen (Tylenol) Non-steroidal anti-inflammatory drugs (NSAIDS) Aspirin, Ibuprofen (Motrin, Advil) Naproxen (Aleve) Take these as needed, when you feel pain. Both acetaminophen and NSAIDs help to decrease pain and swelling (inflammation).      My pain is hard to ignore and is more noticeable even when I rest.  My  pain interferes with my usual activities.  Non-Medication Therapies  +  Non-Opioid medications  Take on a regular schedule (around-the-clock) instead of as needed. (For example, Tylenol every 6 hours at 9:00 am, 3:00 pm, 9:00 pm, 3:00 am and Motrin every 6 hours at 12:00 am, 6:00 am, 12:00 pm, 6:00 pm)         I am focused on my pain, and I am not doing my daily activities.  I am groaning in pain, and I cannot sleep. I am unable to do anything.  My pain is as bad as it could be, and nothing else matters.  Non-Medication Therapies  +  Around-the-Clock Non-Opioid Medications  +  Short-acting opioids  Opioids should be used with other medications to manage severe pain. Opioids block pain and give a feeling of euphoria (feel high). Addiction, a serious side effect of opioids, is rare with short-term (a few days) use.  Examples of short-acting opioids include: Tramadol (Ultram), Hydrocodone (Norco, Vicodin), Hydromorphone (Dilaudid), Oxycodone (Oxycontin)     The above directions have been adapted from   the American College [employer] Surgical Patient Education Program.  Please refer to the ACS website if needed: https://www.facs.org/-/media/files/education/patient-ed/cholesys.ashx.   Obie Kallenbach, MD [facility] [address], [address], [place], [place]  [address] ?  [address] [address], [place],    [address] [phone] ? [phone] ? FAX [phone] Web site: [email or web address]  

## 2022-03-25 NOTE — H&P (Signed)
  51 year old male s/p laparoscopic cholecystectomy 03/19/22 for acute cholecystitis.  Also found to have cirrhotic appearing liver.  Presents with cellulitis of the right abdominal wall tracking down to the groin.  WBC normal.  LFT's improved from admission - T. Bili normal.  Some ascites.  Stranding in the right abdominal wall but no air, no abscess.  No findings consistent with necrotizing soft tissue infection per EDP.  Will admit for IV antibiotics and monitoring.  Full note to follow in AM.  Imogene Burn. Georgette Dover, MD, [facility]  General Surgery   03/25/2022 12:53 AM

## 2022-03-25 NOTE — ED Provider Notes (Signed)
[facility] Provider Note   CSN: [id number] Arrival date & time: 03/24/22  1524     History  Chief Complaint  Patient presents with   Post-op Problem    DELVECCHIO MADOLE is a 51 y.o. male.  The history is provided by the patient and the spouse.  Wound Check This is a new problem. The current episode started more than 2 days ago. The problem occurs constantly. The problem has Matthews gradually worsening. Pertinent negatives include no chest pain, no abdominal pain, no headaches and no shortness of breath. Nothing aggravates the symptoms. Nothing relieves the symptoms. He has tried nothing for the symptoms. The treatment provided no relief.  Patient who is POD 5 from laparoscopic cholecystectomy presents with worsening redness of the abdominal wall to the scrotum.  No fevers.  No nausea, vomiting or diarrhea.       Home Medications Prior to Admission medications   Medication Sig Start Date End Date Taking? Authorizing Provider  ALPRAZolam (XANAX) 0.25 MG tablet Take 0.25 mg by mouth 2 (two) times daily as needed for anxiety.    [provider]  FLUoxetine (PROZAC) 20 MG capsule Take 1 capsule (20 mg total) by mouth daily. 11/06/21 05/05/22  Patrecia Pour, NP  gabapentin (NEURONTIN) 600 MG tablet Take 1 tablet (600 mg total) by mouth at bedtime. 11/06/21 05/05/22  Patrecia Pour, NP  ibuprofen (ADVIL,MOTRIN) 200 MG tablet Take 200-600 mg by mouth every 6 (six) hours as needed for headache or moderate pain.    [provider]  oxyCODONE-acetaminophen (PERCOCET) 5-325 MG tablet Take 1 tablet by mouth every 4 (four) hours as needed for severe pain. 03/19/22 03/19/23  Stechschulte, Nickola Major, MD  risperiDONE (RISPERDAL) 1 MG tablet Take 1 tablet (1 mg total) by mouth at bedtime. 11/06/21   Patrecia Pour, NP  sildenafil (VIAGRA) 50 MG tablet Take 1 tablet (50 mg total) by mouth daily as needed for erectile dysfunction. 12/07/19   Delight Hoh, MD  zolpidem (AMBIEN) 10 MG tablet Take 1.5 tablets (15 mg total) by mouth at bedtime. 11/06/21   Patrecia Pour, NP      Allergies    Patient has no known allergies.    Review of Systems   Review of Systems  Constitutional:  Negative for fever.  HENT:  Negative for facial swelling.   Respiratory:  Negative for shortness of breath.   Cardiovascular:  Negative for chest pain.  Gastrointestinal:  Negative for abdominal pain, diarrhea and vomiting.  Skin:  Positive for color change.  Neurological:  Negative for headaches.  All other systems reviewed and are negative.   Physical Exam Updated Vital Signs BP (!) 142/95   Pulse 65   Temp 98.2 F (36.8 C)   Resp 17   Ht '6\' 2"'$  (1.88 m)   Wt 85 kg   SpO2 95%   BMI 24.06 kg/m  Physical Exam Vitals and nursing note reviewed.  Constitutional:      General: He is not in acute distress.    Appearance: Normal appearance. He is well-developed. He is not diaphoretic.  HENT:     Head: Normocephalic and atraumatic.     Nose: Nose normal.  Eyes:     Conjunctiva/sclera: Conjunctivae normal.     Pupils: Pupils are equal, round, and reactive to light.  Cardiovascular:     Rate and Rhythm: Normal rate and regular rhythm.  Pulmonary:     Effort: Pulmonary effort is  normal.     Breath sounds: Normal breath sounds. No wheezing or rales.  Abdominal:     Palpations: Abdomen is soft.     Tenderness: There is abdominal tenderness. There is no guarding or rebound.     Comments: See pictures, there is both erythema with warmth and ecchymosis near the inguinal canal   Musculoskeletal:        General: Normal range of motion.     Cervical back: Normal range of motion and neck supple.  Skin:    General: Skin is warm and dry.     Capillary Refill: Capillary refill takes less than 2 seconds.  Neurological:     General: No focal deficit present.     Mental Status: He is alert and oriented to person, place, and time.     Deep Tendon  Reflexes: Reflexes normal.  Psychiatric:        Mood and Affect: Mood normal.        Behavior: Behavior normal.          ED Results / Procedures / Treatments   Labs (all labs ordered are listed, but only abnormal results are displayed) Results for orders placed or performed during the hospital encounter of 03/24/22  Comprehensive metabolic panel  Result Value Ref Range   Sodium 130 (L) 135 - 145 mmol/L   Potassium 4.0 3.5 - 5.1 mmol/L   Chloride 96 (L) 98 - 111 mmol/L   CO2 25 22 - 32 mmol/L   Glucose, Bld 98 70 - 99 mg/dL   BUN 12 6 - 20 mg/dL   Creatinine, Ser 0.62 0.61 - 1.24 mg/dL   Calcium 8.6 (L) 8.9 - 10.3 mg/dL   Total Protein 6.2 (L) 6.5 - 8.1 g/dL   Albumin 3.0 (L) 3.5 - 5.0 g/dL   AST 49 (H) 15 - 41 U/L   ALT 41 0 - 44 U/L   Alkaline Phosphatase 133 (H) 38 - 126 U/L   Total Bilirubin 0.9 0.3 - 1.2 mg/dL   GFR, Estimated >60 >60 mL/min   Anion gap 9 5 - 15  CBC with Differential  Result Value Ref Range   WBC 8.5 4.0 - 10.5 K/uL   RBC 3.18 (L) 4.22 - 5.81 MIL/uL   Hemoglobin 12.7 (L) 13.0 - 17.0 g/dL   HCT 35.9 (L) 39.0 - 52.0 %   MCV 112.9 (H) 80.0 - 100.0 fL   MCH 39.9 (H) 26.0 - 34.0 pg   MCHC 35.4 30.0 - 36.0 g/dL   RDW 14.1 11.5 - 15.5 %   Platelets 106 (L) 150 - 400 K/uL   nRBC 0.0 0.0 - 0.2 %   Neutrophils Relative % 78 %   Neutro Abs 6.7 1.7 - 7.7 K/uL   Lymphocytes Relative 13 %   Lymphs Abs 1.1 0.7 - 4.0 K/uL   Monocytes Relative 7 %   Monocytes Absolute 0.6 0.1 - 1.0 K/uL   Eosinophils Relative 1 %   Eosinophils Absolute 0.1 0.0 - 0.5 K/uL   Basophils Relative 0 %   Basophils Absolute 0.0 0.0 - 0.1 K/uL   Immature Granulocytes 1 %   Abs Immature Granulocytes 0.04 0.00 - 0.07 K/uL  Lipase, blood  Result Value Ref Range   Lipase 50 11 - 51 U/L  Urinalysis, Routine w reflex microscopic Urine, Clean Catch  Result Value Ref Range   Color, Urine AMBER (A) YELLOW   APPearance CLEAR CLEAR   Specific Gravity, Urine 1.010 1.005 - 1.030  pH  6.0 5.0 - 8.0   Glucose, UA NEGATIVE NEGATIVE mg/dL   Hgb urine dipstick NEGATIVE NEGATIVE   Bilirubin Urine NEGATIVE NEGATIVE   Ketones, ur NEGATIVE NEGATIVE mg/dL   Protein, ur 30 (A) NEGATIVE mg/dL   Nitrite NEGATIVE NEGATIVE   Leukocytes,Ua NEGATIVE NEGATIVE   RBC / HPF 0-5 0 - 5 RBC/hpf   WBC, UA 0-5 0 - 5 WBC/hpf   Bacteria, UA NONE SEEN NONE SEEN   Squamous Epithelial / LPF 0-5 0 - 5   CT Abdomen Pelvis W Contrast  Result Date: 03/24/2022 CLINICAL DATA:  Postop abdominal pain. Cholecystectomy 03/19/2022. Abdominal distension, increased abdominal pain, redness to right side of abdomen and groin. EXAM: CT ABDOMEN AND PELVIS WITH CONTRAST TECHNIQUE: Multidetector CT imaging of the abdomen and pelvis was performed using the standard protocol following bolus administration of intravenous contrast. RADIATION DOSE REDUCTION: This exam was performed according to the departmental dose-optimization program which includes automated exposure control, adjustment of the mA and/or kV according to patient size and/or use of iterative reconstruction technique. CONTRAST:  144m OMNIPAQUE IOHEXOL 300 MG/ML  SOLN COMPARISON:  02/11/2014. FINDINGS: Lower chest: Mild atelectasis at the left lung base. Hepatobiliary: No focal liver abnormality. No biliary ductal dilatation. Cholecystectomy clips are present in the gallbladder fossa. There is a small amount of slightly complex fluid at the gallbladder fossa. Pancreas: There is diffuse peripancreatic fat stranding. No pancreatic ductal dilatation or pseudocyst formation. A trace amount of free fluid is noted in the anterior pararenal space on the left. Spleen: The spleen is mildly enlarged at 15.4 cm in length. Adrenals/Urinary Tract: No adrenal nodule or mass. The kidneys enhance symmetrically. No renal calculus or hydronephrosis. The bladder is unremarkable. Stomach/Bowel: Stomach is within normal limits. Appendix appears normal. No evidence of bowel wall  thickening, distention, or inflammatory changes. No free air or pneumatosis. Scattered diverticula are noted along the colon without evidence of diverticulitis. Vascular/Lymphatic: Aortic atherosclerosis. A few prominent lymph nodes are present in the gastrohepatic ligament and at the porta hepatis which are likely reactive. Reproductive: Prostate is unremarkable. Other: Perihepatic free fluid is noted. There is a small amount of free fluid in the right pericolic gutter and pelvis. Subcutaneous edema is noted at the umbilicus and along the right anterolateral abdominal wall. No abscess is seen Musculoskeletal: Degenerative changes in the thoracolumbar spine. Lytic and sclerotic lesions are noted at the femoral heads bilaterally, compatible with avascular necrosis. No acute osseous abnormality. IMPRESSION: 1. Status post cholecystectomy with a small amount of slightly complex fluid in the gallbladder fossa, possible residual postop blood products. Mild ascites is noted in the perihepatic space, right pericolic gutter, and pelvis, raising the possibility of bile leak. Nuclear medicine hepatobiliary scan is suggested for further evaluation. 2. Diffuse peripancreatic fat stranding, compatible with pancreatitis. Correlation with amylase and lipase is recommended. 3. Mild splenomegaly. 4. Diverticulosis without diverticulitis. 5. Aortic atherosclerosis. Electronically Signed   By: LBrett FairyM.D.   On: 03/24/2022 20:49   DG C-Arm 1-60 Min-No Report  Result Date: 03/19/2022 Fluoroscopy was utilized by the requesting physician.  No radiographic interpretation.    None  Radiology CT Abdomen Pelvis W Contrast  Result Date: 03/24/2022 CLINICAL DATA:  Postop abdominal pain. Cholecystectomy 03/19/2022. Abdominal distension, increased abdominal pain, redness to right side of abdomen and groin. EXAM: CT ABDOMEN AND PELVIS WITH CONTRAST TECHNIQUE: Multidetector CT imaging of the abdomen and pelvis was performed using  the standard protocol following bolus administration of intravenous contrast. RADIATION  DOSE REDUCTION: This exam was performed according to the departmental dose-optimization program which includes automated exposure control, adjustment of the mA and/or kV according to patient size and/or use of iterative reconstruction technique. CONTRAST:  166m OMNIPAQUE IOHEXOL 300 MG/ML  SOLN COMPARISON:  02/11/2014. FINDINGS: Lower chest: Mild atelectasis at the left lung base. Hepatobiliary: No focal liver abnormality. No biliary ductal dilatation. Cholecystectomy clips are present in the gallbladder fossa. There is a small amount of slightly complex fluid at the gallbladder fossa. Pancreas: There is diffuse peripancreatic fat stranding. No pancreatic ductal dilatation or pseudocyst formation. A trace amount of free fluid is noted in the anterior pararenal space on the left. Spleen: The spleen is mildly enlarged at 15.4 cm in length. Adrenals/Urinary Tract: No adrenal nodule or mass. The kidneys enhance symmetrically. No renal calculus or hydronephrosis. The bladder is unremarkable. Stomach/Bowel: Stomach is within normal limits. Appendix appears normal. No evidence of bowel wall thickening, distention, or inflammatory changes. No free air or pneumatosis. Scattered diverticula are noted along the colon without evidence of diverticulitis. Vascular/Lymphatic: Aortic atherosclerosis. A few prominent lymph nodes are present in the gastrohepatic ligament and at the porta hepatis which are likely reactive. Reproductive: Prostate is unremarkable. Other: Perihepatic free fluid is noted. There is a small amount of free fluid in the right pericolic gutter and pelvis. Subcutaneous edema is noted at the umbilicus and along the right anterolateral abdominal wall. No abscess is seen Musculoskeletal: Degenerative changes in the thoracolumbar spine. Lytic and sclerotic lesions are noted at the femoral heads bilaterally, compatible with  avascular necrosis. No acute osseous abnormality. IMPRESSION: 1. Status post cholecystectomy with a small amount of slightly complex fluid in the gallbladder fossa, possible residual postop blood products. Mild ascites is noted in the perihepatic space, right pericolic gutter, and pelvis, raising the possibility of bile leak. Nuclear medicine hepatobiliary scan is suggested for further evaluation. 2. Diffuse peripancreatic fat stranding, compatible with pancreatitis. Correlation with amylase and lipase is recommended. 3. Mild splenomegaly. 4. Diverticulosis without diverticulitis. 5. Aortic atherosclerosis. Electronically Signed   By: LBrett FairyM.D.   On: 03/24/2022 20:49    Procedures Procedures    Medications Ordered in ED Medications  ALPRAZolam (XANAX) tablet 0.25 mg (has no administration in time range)  FLUoxetine (PROZAC) capsule 20 mg (20 mg Oral Given 03/25/22 0158)  risperiDONE (RISPERDAL) tablet 1 mg (1 mg Oral Given 03/25/22 0158)  enoxaparin (LOVENOX) injection 40 mg (has no administration in time range)  0.9 %  sodium chloride infusion ( Intravenous New Bag/Given 03/25/22 0209)  acetaminophen (TYLENOL) tablet 650 mg (has no administration in time range)    Or  acetaminophen (TYLENOL) suppository 650 mg (has no administration in time range)  morphine (PF) 4 MG/ML injection 4 mg (4 mg Intravenous Given 03/25/22 0159)  zolpidem (AMBIEN) tablet 10 mg (10 mg Oral Given 03/25/22 0206)  diphenhydrAMINE (BENADRYL) capsule 25 mg (has no administration in time range)    Or  diphenhydrAMINE (BENADRYL) injection 25 mg (has no administration in time range)  ondansetron (ZOFRAN-ODT) disintegrating tablet 4 mg (has no administration in time range)    Or  ondansetron (ZOFRAN) injection 4 mg (has no administration in time range)  iohexol (OMNIPAQUE) 300 MG/ML solution 100 mL (100 mLs Intravenous Contrast Given 03/24/22 2020)  iohexol (OMNIPAQUE) 9 MG/ML oral solution 250 mL (250 mLs Oral  Contrast Given 03/24/22 2028)  vancomycin (VANCOCIN) IVPB 1000 mg/200 mL premix (0 mg Intravenous Stopped 03/25/22 0159)  piperacillin-tazobactam (ZOSYN) IVPB 3.375 g (  0 g Intravenous Stopped 03/25/22 0120)    ED Course/ Medical Decision Making/ A&P                           Medical Decision Making Patient with worsening color change of the abdominal wall   Amount and/or Complexity of Data Reviewed Independent Historian: spouse    Details: See above  External Data Reviewed: labs, radiology and notes.    Details: Previous notes and admission date reviewed  Labs: ordered.    Details: All labs reviewed:  sodium slightly low 130, normal potassium 4, normal creatinine .62, low albumin elevated alkaline phosphatase.  Elevated AST 49.  Normal white count 8.5, low hemoglobin 12.6, low platelets 106K, top normal lipases 50.  Normal urine  Radiology: ordered and independent interpretation performed.    Details: No obstruction no free air or air in soft tissues  Discussion of management or test interpretation with external provider(s): Case d/w Dr, Lovena Le no findings to suggest necrotizing infection Case d/w Dr. Georgette Dover will admit   Risk Prescription drug management. Decision regarding hospitalization. Risk Details: Patient is not septic.  This does not appear to be a necrotizing infection.  There is cellulitis of the abdomen and also ecchymosis in the groin.  Blood cultures drawn and IV antibiotics initiated.  Patient is resting comfortably.      Final Clinical Impression(s) / ED Diagnoses Final diagnoses:  Cellulitis of abdominal wall  Other ascites   The patient appears reasonably stabilized for admission considering the current resources, flow, and capabilities available in the ED at this time, and I doubt any other [facility] requiring further screening and/or treatment in the ED prior to admission.  Rx / DC Orders ED Discharge Orders     None         Zyan Mirkin, MD 03/25/22  [id number]

## 2022-03-25 NOTE — Discharge Summary (Signed)
  Patient ID: Lance Matthews [id number] 51 y.o. 04/10/1971  03/24/2022  Discharge date and time: 03/25/2022  Admitting Physician: [place]  Discharge Physician: [place]  Admission Diagnoses: Abdominal wall cellulitis [H37.169] Patient Active Problem List   Diagnosis Date Noted   Abdominal wall cellulitis 03/25/2022   Major depressive disorder, recurrent episode, moderate ([place]) 05/22/2021   Major depressive disorder, recurrent episode, mild ([place]) 05/22/2021   Drug-induced erectile dysfunction 06/10/2019   Attention deficit hyperactivity disorder (ADHD), combined type, mild 05/01/2018   Post-traumatic stress disorder, chronic 05/01/2018     Discharge Diagnoses:  Patient Active Problem List   Diagnosis Date Noted   Abdominal wall cellulitis 03/25/2022   Major depressive disorder, recurrent episode, moderate ([place]) 05/22/2021   Major depressive disorder, recurrent episode, mild ([place]) 05/22/2021   Drug-induced erectile dysfunction 06/10/2019   Attention deficit hyperactivity disorder (ADHD), combined type, mild 05/01/2018   Post-traumatic stress disorder, chronic 05/01/2018    Operations:   Admission Condition: good  Discharged Condition: good  Indication for Admission: Bruising to abdominal wall  Hospital Course: Mr. Dyar presented 6 days after cholecystectomy with bruising to his abdominal wall.  CT and HIDA were negative for complication so he was discharged home.  Consults: None  Significant Diagnostic Studies: CT and HIDA  Treatments: none  Disposition: Home  Patient Instructions:  Allergies as of 03/25/2022   No Known Allergies      Medication List     TAKE these medications    ALPRAZolam 0.25 MG tablet Commonly known as: XANAX Take 0.25 mg by mouth 2 (two) times daily as needed for anxiety.   bisacodyl 5 MG EC tablet Commonly known as: DULCOLAX Take 5 mg by mouth daily as needed for moderate constipation.    FLUoxetine 20 MG capsule Commonly known as: PROZAC Take 1 capsule (20 mg total) by mouth daily.   gabapentin 600 MG tablet Commonly known as: NEURONTIN Take 1 tablet (600 mg total) by mouth at bedtime.   ibuprofen 200 MG tablet Commonly known as: ADVIL Take 200-600 mg by mouth every 6 (six) hours as needed for headache or moderate pain.   oxyCODONE-acetaminophen 5-325 MG tablet Commonly known as: Percocet Take 1 tablet by mouth every 4 (four) hours as needed for severe pain. What changed: Another medication with the same name was added. Make sure you understand how and when to take each.   oxyCODONE-acetaminophen 5-325 MG tablet Commonly known as: Percocet Take 1 tablet by mouth every 4 (four) hours as needed for severe pain. What changed: You were already taking a medication with the same name, and this prescription was added. Make sure you understand how and when to take each.   risperiDONE 1 MG tablet Commonly known as: RISPERDAL Take 1 tablet (1 mg total) by mouth at bedtime.   sildenafil 50 MG tablet Commonly known as: VIAGRA Take 1 tablet (50 mg total) by mouth daily as needed for erectile dysfunction.   zolpidem 10 MG tablet Commonly known as: AMBIEN Take 1.5 tablets (15 mg total) by mouth at bedtime.        Activity: no heavy lifting for 3 more weeks Diet: regular diet Wound Care: keep wound clean and dry  Follow-up:  With Dr. Thermon Leyland as previously scheduled  Signed: [place], [place]   03/25/2022, 11:42 AM

## 2022-03-30 LAB — CULTURE, BLOOD (ROUTINE X 2)
Culture: NO GROWTH
Culture: NO GROWTH
Special Requests: ADEQUATE

## 2022-05-21 ENCOUNTER — Other Ambulatory Visit (HOSPITAL_COMMUNITY): Payer: Self-pay | Admitting: Psychiatry

## 2022-05-21 DIAGNOSIS — F4312 Post-traumatic stress disorder, chronic: Secondary | ICD-10-CM

## 2022-05-22 ENCOUNTER — Other Ambulatory Visit (HOSPITAL_COMMUNITY): Payer: Self-pay | Admitting: Psychiatry

## 2022-05-22 DIAGNOSIS — F4312 Post-traumatic stress disorder, chronic: Secondary | ICD-10-CM

## 2022-05-22 MED ORDER — ALPRAZOLAM 0.5 MG PO TABS: ORAL_TABLET | ORAL | 5 refills | Status: DC

## 2022-05-22 MED ORDER — ZOLPIDEM TARTRATE 10 MG PO TABS: 15.0000 mg | ORAL_TABLET | Freq: Every day | ORAL | 5 refills | Status: DC

## 2022-05-22 MED ORDER — GABAPENTIN 600 MG PO TABS: 600.0000 mg | ORAL_TABLET | Freq: Every day | ORAL | 0 refills | Status: DC

## 2022-05-22 MED ORDER — FLUOXETINE HCL 20 MG PO CAPS: 20.0000 mg | ORAL_CAPSULE | Freq: Every day | ORAL | 0 refills | Status: DC

## 2022-05-22 MED ORDER — RISPERIDONE 1 MG PO TABS: 1.0000 mg | ORAL_TABLET | Freq: Every day | ORAL | 6 refills | Status: DC

## 2022-05-22 NOTE — Progress Notes (Signed)
Phone conversation, everything is going well, no side effects from his medications.  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 Rx refilled and explained this provider is resigning, referrals will be sent.  Theodoro Clock Carlisha Wisler,PMHNP

## 2022-05-24 ENCOUNTER — Other Ambulatory Visit (HOSPITAL_COMMUNITY): Payer: Self-pay | Admitting: Psychiatry

## 2022-06-28 ENCOUNTER — Other Ambulatory Visit (HOSPITAL_COMMUNITY): Payer: Self-pay | Admitting: Gastroenterology

## 2022-06-28 DIAGNOSIS — R899 Unspecified abnormal finding in specimens from other organs, systems and tissues: Secondary | ICD-10-CM

## 2022-07-10 ENCOUNTER — Other Ambulatory Visit: Payer: Self-pay | Admitting: Radiology

## 2022-07-10 DIAGNOSIS — R748 Abnormal levels of other serum enzymes: Secondary | ICD-10-CM

## 2022-07-11 ENCOUNTER — Other Ambulatory Visit: Payer: Self-pay | Admitting: Radiology

## 2022-07-11 ENCOUNTER — Ambulatory Visit (HOSPITAL_COMMUNITY)
Admission: RE | Admit: 2022-07-11 | Discharge: 2022-07-11 | Disposition: A | Discharge status: Home / Self Care | Payer: BC Managed Care – PPO | Source: Ambulatory Visit | Attending: Gastroenterology | Admitting: Gastroenterology

## 2022-07-11 ENCOUNTER — Encounter (HOSPITAL_COMMUNITY): Payer: Self-pay

## 2022-07-11 DIAGNOSIS — K7460 Unspecified cirrhosis of liver: Secondary | ICD-10-CM | POA: Insufficient documentation

## 2022-07-11 DIAGNOSIS — R7989 Other specified abnormal findings of blood chemistry: Secondary | ICD-10-CM | POA: Diagnosis not present

## 2022-07-11 DIAGNOSIS — K7581 Nonalcoholic steatohepatitis (NASH): Secondary | ICD-10-CM | POA: Diagnosis not present

## 2022-07-11 DIAGNOSIS — R748 Abnormal levels of other serum enzymes: Secondary | ICD-10-CM

## 2022-07-11 DIAGNOSIS — R899 Unspecified abnormal finding in specimens from other organs, systems and tissues: Secondary | ICD-10-CM

## 2022-07-11 LAB — CBC
HCT: 40.4 % (ref 39.0–52.0)
Hemoglobin: 13.9 g/dL (ref 13.0–17.0)
MCH: 32.7 pg (ref 26.0–34.0)
MCHC: 34.4 g/dL (ref 30.0–36.0)
MCV: 95.1 fL (ref 80.0–100.0)
Platelets: 103 10*3/uL — ABNORMAL LOW (ref 150–400)
RBC: 4.25 MIL/uL (ref 4.22–5.81)
RDW: 13.8 % (ref 11.5–15.5)
WBC: 6.8 10*3/uL (ref 4.0–10.5)
nRBC: 0 % (ref 0.0–0.2)

## 2022-07-11 LAB — PROTIME-INR
INR: 1 (ref 0.8–1.2)
Prothrombin Time: 13.4 seconds (ref 11.4–15.2)

## 2022-07-11 MED ORDER — LIDOCAINE HCL (PF) 1 % IJ SOLN
10.0000 mL | Freq: Once | INTRAMUSCULAR | Status: AC
  Administered 2022-07-11: 10 mL via INTRADERMAL

## 2022-07-11 MED ORDER — FENTANYL CITRATE (PF) 100 MCG/2ML IJ SOLN
INTRAMUSCULAR | Status: AC
  Filled 2022-07-11: qty 2

## 2022-07-11 MED ORDER — HYDROCODONE-ACETAMINOPHEN 5-325 MG PO TABS: 1.0000 | ORAL_TABLET | ORAL | Status: DC | PRN

## 2022-07-11 MED ORDER — GELATIN ABSORBABLE 12-7 MM EX MISC
1.0000 | Freq: Once | CUTANEOUS | Status: AC
  Administered 2022-07-11: 1 via TOPICAL

## 2022-07-11 MED ORDER — MIDAZOLAM HCL 2 MG/2ML IJ SOLN
INTRAMUSCULAR | Status: AC
  Filled 2022-07-11: qty 2

## 2022-07-11 MED ORDER — MIDAZOLAM HCL 2 MG/2ML IJ SOLN
INTRAMUSCULAR | Status: AC | PRN
  Administered 2022-07-11 (×2): .5 mg via INTRAVENOUS
  Administered 2022-07-11: 1 mg via INTRAVENOUS

## 2022-07-11 MED ORDER — FENTANYL CITRATE (PF) 100 MCG/2ML IJ SOLN
INTRAMUSCULAR | Status: AC | PRN
  Administered 2022-07-11: 50 ug via INTRAVENOUS
  Administered 2022-07-11 (×2): 25 ug via INTRAVENOUS

## 2022-07-11 MED ORDER — SODIUM CHLORIDE 0.9 % IV SOLN: INTRAVENOUS | Status: DC

## 2022-07-11 NOTE — Procedures (Signed)
Interventional Radiology Procedure:   Indications: Abnormal LFTs  Procedure: US guided liver biopsy  Findings: 3 cores from left hepatic lobe.  No immediate bleeding  Complications: None     EBL: Minimal  Plan: Bedrest 3 hours, then discharge to home   Caide Campi R. Anselm Pancoast, MD  Pager: [phone]
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
## 2022-07-19 LAB — SURGICAL PATHOLOGY

## 2022-07-22 ENCOUNTER — Other Ambulatory Visit (HOSPITAL_COMMUNITY): Payer: Self-pay | Admitting: Psychiatry

## 2022-07-22 DIAGNOSIS — F4312 Post-traumatic stress disorder, chronic: Secondary | ICD-10-CM

## 2022-07-31 ENCOUNTER — Other Ambulatory Visit: Payer: Self-pay | Admitting: *Deleted

## 2022-07-31 ENCOUNTER — Inpatient Hospital Stay: Payer: BC Managed Care – PPO

## 2022-07-31 ENCOUNTER — Encounter: Payer: Self-pay | Admitting: Nurse Practitioner

## 2022-07-31 ENCOUNTER — Ambulatory Visit: Payer: BC Managed Care – PPO

## 2022-07-31 ENCOUNTER — Inpatient Hospital Stay: Payer: BC Managed Care – PPO | Attending: Nurse Practitioner | Admitting: Nurse Practitioner

## 2022-07-31 DIAGNOSIS — R160 Hepatomegaly, not elsewhere classified: Secondary | ICD-10-CM | POA: Diagnosis not present

## 2022-07-31 DIAGNOSIS — F17210 Nicotine dependence, cigarettes, uncomplicated: Secondary | ICD-10-CM | POA: Diagnosis not present

## 2022-07-31 DIAGNOSIS — Z9049 Acquired absence of other specified parts of digestive tract: Secondary | ICD-10-CM | POA: Diagnosis not present

## 2022-07-31 DIAGNOSIS — R7989 Other specified abnormal findings of blood chemistry: Secondary | ICD-10-CM | POA: Insufficient documentation

## 2022-07-31 DIAGNOSIS — Z79899 Other long term (current) drug therapy: Secondary | ICD-10-CM | POA: Diagnosis not present

## 2022-07-31 DIAGNOSIS — K7460 Unspecified cirrhosis of liver: Secondary | ICD-10-CM | POA: Insufficient documentation

## 2022-07-31 DIAGNOSIS — K828 Other specified diseases of gallbladder: Secondary | ICD-10-CM | POA: Diagnosis not present

## 2022-07-31 NOTE — Progress Notes (Signed)
Ordering error

## 2022-07-31 NOTE — Progress Notes (Signed)
Lance Matthews presents today for phlebotomy per MD orders. Phlebotomy procedure started at 1358 and ended at 1415. 398 grams removed. Patient observed for 30 minutes after procedure without any incident. Patient tolerated procedure well. IV needle removed intact. 16G kit used to RAC.
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
## 2022-08-01 ENCOUNTER — Telehealth: Payer: Self-pay | Admitting: Nurse Practitioner

## 2022-08-01 NOTE — Telephone Encounter (Signed)
Contacted patient to scheduled appointments. Patient is aware of appointments that are scheduled.   

## 2022-08-14 ENCOUNTER — Encounter: Payer: Self-pay | Admitting: Gastroenterology

## 2022-08-14 ENCOUNTER — Other Ambulatory Visit: Payer: Self-pay | Admitting: Gastroenterology

## 2022-08-14 DIAGNOSIS — K7469 Other cirrhosis of liver: Secondary | ICD-10-CM

## 2022-08-15 ENCOUNTER — Inpatient Hospital Stay: Payer: BC Managed Care – PPO | Attending: Nurse Practitioner

## 2022-08-15 ENCOUNTER — Inpatient Hospital Stay: Payer: BC Managed Care – PPO

## 2022-08-15 LAB — CMP (CANCER CENTER ONLY)
ALT: 48 U/L — ABNORMAL HIGH (ref 0–44)
AST: 33 U/L (ref 15–41)
Albumin: 4.2 g/dL (ref 3.5–5.0)
Alkaline Phosphatase: 156 U/L — ABNORMAL HIGH (ref 38–126)
Anion gap: 6 (ref 5–15)
BUN: 15 mg/dL (ref 6–20)
CO2: 28 mmol/L (ref 22–32)
Calcium: 8.9 mg/dL (ref 8.9–10.3)
Chloride: 106 mmol/L (ref 98–111)
Creatinine: 1.05 mg/dL (ref 0.61–1.24)
GFR, Estimated: >60 mL/min (ref >60)
Glucose, Bld: 124 mg/dL — ABNORMAL HIGH (ref 70–99)
Potassium: 3.9 mmol/L (ref 3.5–5.1)
Sodium: 140 mmol/L (ref 135–145)
Total Bilirubin: 0.4 mg/dL (ref 0.3–1.2)
Total Protein: 6.3 g/dL — ABNORMAL LOW (ref 6.5–8.1)

## 2022-08-15 LAB — CBC WITH DIFFERENTIAL (CANCER CENTER ONLY)
Abs Immature Granulocytes: 0.02 10*3/uL (ref 0.00–0.07)
Basophils Absolute: 0 10*3/uL (ref 0.0–0.1)
Basophils Relative: 0 %
Eosinophils Absolute: 0.1 10*3/uL (ref 0.0–0.5)
Eosinophils Relative: 2 %
HCT: 36.7 % — ABNORMAL LOW (ref 39.0–52.0)
Hemoglobin: 12.7 g/dL — ABNORMAL LOW (ref 13.0–17.0)
Immature Granulocytes: 0 %
Lymphocytes Relative: 31 %
Lymphs Abs: 2.3 10*3/uL (ref 0.7–4.0)
MCH: 32.6 pg (ref 26.0–34.0)
MCHC: 34.6 g/dL (ref 30.0–36.0)
MCV: 94.3 fL (ref 80.0–100.0)
Monocytes Absolute: 0.3 10*3/uL (ref 0.1–1.0)
Monocytes Relative: 4 %
Neutro Abs: 4.8 10*3/uL (ref 1.7–7.7)
Neutrophils Relative %: 63 %
Platelet Count: 120 10*3/uL — ABNORMAL LOW (ref 150–400)
RBC: 3.89 MIL/uL — ABNORMAL LOW (ref 4.22–5.81)
RDW: 14.5 % (ref 11.5–15.5)
Smear Review: NORMAL
WBC Count: 7.6 10*3/uL (ref 4.0–10.5)
nRBC: 0 % (ref 0.0–0.2)

## 2022-08-15 LAB — IRON AND IRON BINDING CAPACITY (CC-WL,HP ONLY)
Iron: 191 ug/dL — ABNORMAL HIGH (ref 45–182)
Saturation Ratios: 82 % — ABNORMAL HIGH (ref 17.9–39.5)
TIBC: 234 ug/dL — ABNORMAL LOW (ref 250–450)
UIBC: 43 ug/dL — ABNORMAL LOW (ref 117–376)

## 2022-08-15 NOTE — Progress Notes (Signed)
Lance Matthews presents today for phlebotomy per MD orders. Iron saturation 82%, Ferritin on 06/24/22 (previous ferritin) 745.  Phlebotomy procedure started at 1529 and ended at 1535. 530 grams removed. Patient observed after procedure without any incident. Denies dizziness/lightheadedness. Declined post-phlebotomy hydration/fluids. Patient tolerated procedure well. IV needle removed intact.

## 2022-08-15 NOTE — Patient Instructions (Signed)

## 2022-08-16 LAB — FERRITIN: Ferritin: 466 ng/mL — ABNORMAL HIGH (ref 24–336)

## 2022-08-17 LAB — AFP TUMOR MARKER: AFP, Serum, Tumor Marker: 2.5 ng/mL (ref 0.0–6.9)

## 2022-08-26 ENCOUNTER — Ambulatory Visit
Admission: RE | Admit: 2022-08-26 | Discharge: 2022-08-26 | Disposition: A | Discharge status: Home / Self Care | Payer: BC Managed Care – PPO | Source: Ambulatory Visit | Attending: Gastroenterology | Admitting: Gastroenterology

## 2022-08-26 DIAGNOSIS — K7469 Other cirrhosis of liver: Secondary | ICD-10-CM

## 2022-08-29 ENCOUNTER — Inpatient Hospital Stay: Payer: BC Managed Care – PPO

## 2022-08-29 LAB — CBC WITH DIFFERENTIAL (CANCER CENTER ONLY)
Abs Immature Granulocytes: 0.02 10*3/uL (ref 0.00–0.07)
Basophils Absolute: 0 10*3/uL (ref 0.0–0.1)
Basophils Relative: 0 %
Eosinophils Absolute: 0.1 10*3/uL (ref 0.0–0.5)
Eosinophils Relative: 2 %
HCT: 37.9 % — ABNORMAL LOW (ref 39.0–52.0)
Hemoglobin: 13.1 g/dL (ref 13.0–17.0)
Immature Granulocytes: 0 %
Lymphocytes Relative: 38 %
Lymphs Abs: 2.6 10*3/uL (ref 0.7–4.0)
MCH: 33.2 pg (ref 26.0–34.0)
MCHC: 34.6 g/dL (ref 30.0–36.0)
MCV: 96.2 fL (ref 80.0–100.0)
Monocytes Absolute: 0.4 10*3/uL (ref 0.1–1.0)
Monocytes Relative: 6 %
Neutro Abs: 3.7 10*3/uL (ref 1.7–7.7)
Neutrophils Relative %: 54 %
Platelet Count: 133 10*3/uL — ABNORMAL LOW (ref 150–400)
RBC: 3.94 MIL/uL — ABNORMAL LOW (ref 4.22–5.81)
RDW: 14.8 % (ref 11.5–15.5)
WBC Count: 6.8 10*3/uL (ref 4.0–10.5)
nRBC: 0 % (ref 0.0–0.2)

## 2022-08-29 LAB — CMP (CANCER CENTER ONLY)
ALT: 40 U/L (ref 0–44)
AST: 29 U/L (ref 15–41)
Albumin: 4.4 g/dL (ref 3.5–5.0)
Alkaline Phosphatase: 169 U/L — ABNORMAL HIGH (ref 38–126)
Anion gap: 5 (ref 5–15)
BUN: 16 mg/dL (ref 6–20)
CO2: 27 mmol/L (ref 22–32)
Calcium: 8.8 mg/dL — ABNORMAL LOW (ref 8.9–10.3)
Chloride: 107 mmol/L (ref 98–111)
Creatinine: 1.01 mg/dL (ref 0.61–1.24)
GFR, Estimated: >60 mL/min (ref >60)
Glucose, Bld: 82 mg/dL (ref 70–99)
Potassium: 4.6 mmol/L (ref 3.5–5.1)
Sodium: 139 mmol/L (ref 135–145)
Total Bilirubin: 0.3 mg/dL (ref 0.3–1.2)
Total Protein: 6.5 g/dL (ref 6.5–8.1)

## 2022-08-29 LAB — IRON AND IRON BINDING CAPACITY (CC-WL,HP ONLY)
Iron: 195 ug/dL — ABNORMAL HIGH (ref 45–182)
Saturation Ratios: 75 % — ABNORMAL HIGH (ref 17.9–39.5)
TIBC: 262 ug/dL (ref 250–450)
UIBC: 67 ug/dL — ABNORMAL LOW (ref 117–376)

## 2022-08-29 NOTE — Progress Notes (Signed)
Lance Matthews presents today for phlebotomy per MD orders. Phlebotomy procedure started at 1458 w/ 16 G phlebotmy set and ended at 1505. 534 grams removed. Patient declined 30 minutes post observation after procedure.  Patient tolerated procedure well. IV needle removed intact. Vss at discharge

## 2022-08-29 NOTE — Patient Instructions (Signed)

## 2022-08-30 LAB — FERRITIN: Ferritin: 534 ng/mL — ABNORMAL HIGH (ref 24–336)

## 2022-09-12 ENCOUNTER — Inpatient Hospital Stay: Payer: BC Managed Care – PPO

## 2022-09-12 LAB — CBC WITH DIFFERENTIAL (CANCER CENTER ONLY)
Abs Immature Granulocytes: 0.03 10*3/uL (ref 0.00–0.07)
Basophils Absolute: 0 10*3/uL (ref 0.0–0.1)
Basophils Relative: 0 %
Eosinophils Absolute: 0.1 10*3/uL (ref 0.0–0.5)
Eosinophils Relative: 2 %
HCT: 39.3 % (ref 39.0–52.0)
Hemoglobin: 13.9 g/dL (ref 13.0–17.0)
Immature Granulocytes: 0 %
Lymphocytes Relative: 31 %
Lymphs Abs: 2.4 10*3/uL (ref 0.7–4.0)
MCH: 33.7 pg (ref 26.0–34.0)
MCHC: 35.4 g/dL (ref 30.0–36.0)
MCV: 95.4 fL (ref 80.0–100.0)
Monocytes Absolute: 0.4 10*3/uL (ref 0.1–1.0)
Monocytes Relative: 5 %
Neutro Abs: 4.8 10*3/uL (ref 1.7–7.7)
Neutrophils Relative %: 62 %
Platelet Count: 104 10*3/uL — ABNORMAL LOW (ref 150–400)
RBC: 4.12 MIL/uL — ABNORMAL LOW (ref 4.22–5.81)
RDW: 15 % (ref 11.5–15.5)
WBC Count: 7.8 10*3/uL (ref 4.0–10.5)
nRBC: 0 % (ref 0.0–0.2)

## 2022-09-12 LAB — CMP (CANCER CENTER ONLY)
ALT: 35 U/L (ref 0–44)
AST: 27 U/L (ref 15–41)
Albumin: 4.6 g/dL (ref 3.5–5.0)
Alkaline Phosphatase: 151 U/L — ABNORMAL HIGH (ref 38–126)
Anion gap: 6 (ref 5–15)
BUN: 16 mg/dL (ref 6–20)
CO2: 28 mmol/L (ref 22–32)
Calcium: 9.5 mg/dL (ref 8.9–10.3)
Chloride: 104 mmol/L (ref 98–111)
Creatinine: 0.94 mg/dL (ref 0.61–1.24)
GFR, Estimated: >60 mL/min (ref >60)
Glucose, Bld: 122 mg/dL — ABNORMAL HIGH (ref 70–99)
Potassium: 4.2 mmol/L (ref 3.5–5.1)
Sodium: 138 mmol/L (ref 135–145)
Total Bilirubin: 0.5 mg/dL (ref 0.3–1.2)
Total Protein: 7.1 g/dL (ref 6.5–8.1)

## 2022-09-12 LAB — IRON AND IRON BINDING CAPACITY (CC-WL,HP ONLY)
Iron: 144 ug/dL (ref 45–182)
Saturation Ratios: 50 % — ABNORMAL HIGH (ref 17.9–39.5)
TIBC: 287 ug/dL (ref 250–450)
UIBC: 143 ug/dL (ref 117–376)

## 2022-09-12 LAB — FERRITIN: Ferritin: 402 ng/mL — ABNORMAL HIGH (ref 24–336)

## 2022-09-12 NOTE — Progress Notes (Signed)
Lance Matthews presents today for phlebotomy per MD orders. 16 gauge phlebotomy kit used. Phlebotomy procedure started at 1520 and ended at 1530. 543 grams removed. Patient observed for 30 minutes after procedure without any incident. Food and fluids offered pt. Had fluids.  Patient tolerated procedure well. IV needle removed intact. Pt. declined to stay for 30 minute post observation. Left via ambulation, no respiratory distress noted.

## 2022-09-26 ENCOUNTER — Inpatient Hospital Stay: Payer: BC Managed Care – PPO | Attending: Nurse Practitioner

## 2022-09-26 ENCOUNTER — Inpatient Hospital Stay: Payer: BC Managed Care – PPO

## 2022-09-26 ENCOUNTER — Other Ambulatory Visit: Payer: Self-pay

## 2022-09-26 LAB — IRON AND IRON BINDING CAPACITY (CC-WL,HP ONLY)
Iron: 204 ug/dL — ABNORMAL HIGH (ref 45–182)
Saturation Ratios: 76 % — ABNORMAL HIGH (ref 17.9–39.5)
TIBC: 267 ug/dL (ref 250–450)
UIBC: 63 ug/dL — ABNORMAL LOW (ref 117–376)

## 2022-09-26 LAB — CBC WITH DIFFERENTIAL (CANCER CENTER ONLY)
Abs Immature Granulocytes: 0.02 10*3/uL (ref 0.00–0.07)
Basophils Absolute: 0 10*3/uL (ref 0.0–0.1)
Basophils Relative: 1 %
Eosinophils Absolute: 0.2 10*3/uL (ref 0.0–0.5)
Eosinophils Relative: 2 %
HCT: 38.9 % — ABNORMAL LOW (ref 39.0–52.0)
Hemoglobin: 13.3 g/dL (ref 13.0–17.0)
Immature Granulocytes: 0 %
Lymphocytes Relative: 33 %
Lymphs Abs: 2.7 10*3/uL (ref 0.7–4.0)
MCH: 33.2 pg (ref 26.0–34.0)
MCHC: 34.2 g/dL (ref 30.0–36.0)
MCV: 97 fL (ref 80.0–100.0)
Monocytes Absolute: 0.3 10*3/uL (ref 0.1–1.0)
Monocytes Relative: 4 %
Neutro Abs: 5 10*3/uL (ref 1.7–7.7)
Neutrophils Relative %: 60 %
Platelet Count: 117 10*3/uL — ABNORMAL LOW (ref 150–400)
RBC: 4.01 MIL/uL — ABNORMAL LOW (ref 4.22–5.81)
RDW: 14.5 % (ref 11.5–15.5)
WBC Count: 8.3 10*3/uL (ref 4.0–10.5)
nRBC: 0 % (ref 0.0–0.2)

## 2022-09-26 LAB — CMP (CANCER CENTER ONLY)
ALT: 41 U/L (ref 0–44)
AST: 29 U/L (ref 15–41)
Albumin: 4.2 g/dL (ref 3.5–5.0)
Alkaline Phosphatase: 140 U/L — ABNORMAL HIGH (ref 38–126)
Anion gap: 6 (ref 5–15)
BUN: 16 mg/dL (ref 6–20)
CO2: 28 mmol/L (ref 22–32)
Calcium: 9.1 mg/dL (ref 8.9–10.3)
Chloride: 105 mmol/L (ref 98–111)
Creatinine: 0.88 mg/dL (ref 0.61–1.24)
GFR, Estimated: >60 mL/min (ref >60)
Glucose, Bld: 124 mg/dL — ABNORMAL HIGH (ref 70–99)
Potassium: 4.1 mmol/L (ref 3.5–5.1)
Sodium: 139 mmol/L (ref 135–145)
Total Bilirubin: 0.5 mg/dL (ref 0.3–1.2)
Total Protein: 6.8 g/dL (ref 6.5–8.1)

## 2022-09-26 LAB — FERRITIN: Ferritin: 434 ng/mL — ABNORMAL HIGH (ref 24–336)

## 2022-09-26 NOTE — Progress Notes (Signed)
Lance Matthews presents today for phlebotomy per MD orders. Phlebotomy procedure started at 1450 and ended at 1458. 16G IV phlebotomy kit used to R-AC. 514 grams removed. Patient declined full 30 minute post observation. Patient tolerated procedure well. Vitals stable. Discharged, ambulatory to lobby. IV needle removed intact.

## 2022-10-10 ENCOUNTER — Other Ambulatory Visit: Payer: Self-pay

## 2022-10-10 ENCOUNTER — Inpatient Hospital Stay: Payer: BC Managed Care – PPO

## 2022-10-10 LAB — CBC WITH DIFFERENTIAL (CANCER CENTER ONLY)
Abs Immature Granulocytes: 0.03 10*3/uL (ref 0.00–0.07)
Basophils Absolute: 0 10*3/uL (ref 0.0–0.1)
Basophils Relative: 1 %
Eosinophils Absolute: 0.2 10*3/uL (ref 0.0–0.5)
Eosinophils Relative: 2 %
HCT: 40.7 % (ref 39.0–52.0)
Hemoglobin: 13.8 g/dL (ref 13.0–17.0)
Immature Granulocytes: 0 %
Lymphocytes Relative: 33 %
Lymphs Abs: 2.6 10*3/uL (ref 0.7–4.0)
MCH: 32.9 pg (ref 26.0–34.0)
MCHC: 33.9 g/dL (ref 30.0–36.0)
MCV: 97.1 fL (ref 80.0–100.0)
Monocytes Absolute: 0.4 10*3/uL (ref 0.1–1.0)
Monocytes Relative: 4 %
Neutro Abs: 4.8 10*3/uL (ref 1.7–7.7)
Neutrophils Relative %: 60 %
Platelet Count: 130 10*3/uL — ABNORMAL LOW (ref 150–400)
RBC: 4.19 MIL/uL — ABNORMAL LOW (ref 4.22–5.81)
RDW: 14 % (ref 11.5–15.5)
WBC Count: 8 10*3/uL (ref 4.0–10.5)
nRBC: 0 % (ref 0.0–0.2)

## 2022-10-10 LAB — CMP (CANCER CENTER ONLY)
ALT: 28 U/L (ref 0–44)
AST: 24 U/L (ref 15–41)
Albumin: 4.3 g/dL (ref 3.5–5.0)
Alkaline Phosphatase: 134 U/L — ABNORMAL HIGH (ref 38–126)
Anion gap: 5 (ref 5–15)
BUN: 16 mg/dL (ref 6–20)
CO2: 29 mmol/L (ref 22–32)
Calcium: 9.7 mg/dL (ref 8.9–10.3)
Chloride: 106 mmol/L (ref 98–111)
Creatinine: 0.88 mg/dL (ref 0.61–1.24)
GFR, Estimated: >60 mL/min (ref >60)
Glucose, Bld: 96 mg/dL (ref 70–99)
Potassium: 4 mmol/L (ref 3.5–5.1)
Sodium: 140 mmol/L (ref 135–145)
Total Bilirubin: 0.4 mg/dL (ref 0.3–1.2)
Total Protein: 7.1 g/dL (ref 6.5–8.1)

## 2022-10-10 LAB — IRON AND IRON BINDING CAPACITY (CC-WL,HP ONLY)
Iron: 124 ug/dL (ref 45–182)
Saturation Ratios: 45 % — ABNORMAL HIGH (ref 17.9–39.5)
TIBC: 277 ug/dL (ref 250–450)
UIBC: 153 ug/dL (ref 117–376)

## 2022-10-10 NOTE — Patient Instructions (Signed)

## 2022-10-10 NOTE — Progress Notes (Signed)
Lance Matthews presents today for phlebotomy per MD orders. Phlebotomy procedure started at 1518 and ended at 1528. 499 grams removed via 16 G needle to R AC. IV needle removed intact. Patient declined refreshments and to stay for 30 minutes post-observation following procedure. Patient tolerated procedure well. Vital signs retaken and remained stable.  Patient discharged in stable condition.

## 2022-10-11 LAB — FERRITIN: Ferritin: 325 ng/mL (ref 24–336)

## 2022-10-24 ENCOUNTER — Inpatient Hospital Stay: Payer: BC Managed Care – PPO

## 2022-10-24 ENCOUNTER — Inpatient Hospital Stay: Payer: BC Managed Care – PPO | Attending: Nurse Practitioner

## 2022-10-24 ENCOUNTER — Other Ambulatory Visit: Payer: Self-pay

## 2022-10-24 LAB — CBC WITH DIFFERENTIAL (CANCER CENTER ONLY)
Abs Immature Granulocytes: 0.03 10*3/uL (ref 0.00–0.07)
Basophils Absolute: 0 10*3/uL (ref 0.0–0.1)
Basophils Relative: 0 %
Eosinophils Absolute: 0.1 10*3/uL (ref 0.0–0.5)
Eosinophils Relative: 1 %
HCT: 36 % — ABNORMAL LOW (ref 39.0–52.0)
Hemoglobin: 12.3 g/dL — ABNORMAL LOW (ref 13.0–17.0)
Immature Granulocytes: 1 %
Lymphocytes Relative: 34 %
Lymphs Abs: 2 10*3/uL (ref 0.7–4.0)
MCH: 33.1 pg (ref 26.0–34.0)
MCHC: 34.2 g/dL (ref 30.0–36.0)
MCV: 96.8 fL (ref 80.0–100.0)
Monocytes Absolute: 0.3 10*3/uL (ref 0.1–1.0)
Monocytes Relative: 6 %
Neutro Abs: 3.4 10*3/uL (ref 1.7–7.7)
Neutrophils Relative %: 58 %
Platelet Count: 130 10*3/uL — ABNORMAL LOW (ref 150–400)
RBC: 3.72 MIL/uL — ABNORMAL LOW (ref 4.22–5.81)
RDW: 13.9 % (ref 11.5–15.5)
WBC Count: 5.9 10*3/uL (ref 4.0–10.5)
nRBC: 0 % (ref 0.0–0.2)

## 2022-10-24 LAB — CMP (CANCER CENTER ONLY)
ALT: 39 U/L (ref 0–44)
AST: 29 U/L (ref 15–41)
Albumin: 4 g/dL (ref 3.5–5.0)
Alkaline Phosphatase: 189 U/L — ABNORMAL HIGH (ref 38–126)
Anion gap: 5 (ref 5–15)
BUN: 12 mg/dL (ref 6–20)
CO2: 25 mmol/L (ref 22–32)
Calcium: 8.8 mg/dL — ABNORMAL LOW (ref 8.9–10.3)
Chloride: 109 mmol/L (ref 98–111)
Creatinine: 0.93 mg/dL (ref 0.61–1.24)
GFR, Estimated: >60 mL/min (ref >60)
Glucose, Bld: 99 mg/dL (ref 70–99)
Potassium: 3.8 mmol/L (ref 3.5–5.1)
Sodium: 139 mmol/L (ref 135–145)
Total Bilirubin: 0.4 mg/dL (ref 0.3–1.2)
Total Protein: 6.4 g/dL — ABNORMAL LOW (ref 6.5–8.1)

## 2022-10-24 LAB — IRON AND IRON BINDING CAPACITY (CC-WL,HP ONLY)
Iron: 184 ug/dL — ABNORMAL HIGH (ref 45–182)
Saturation Ratios: 73 % — ABNORMAL HIGH (ref 17.9–39.5)
TIBC: 252 ug/dL (ref 250–450)
UIBC: 68 ug/dL — ABNORMAL LOW (ref 117–376)

## 2022-10-24 LAB — FERRITIN: Ferritin: 363 ng/mL — ABNORMAL HIGH (ref 24–336)

## 2022-10-24 MED ORDER — SODIUM CHLORIDE 0.9 % IV SOLN: INTRAVENOUS | Status: DC

## 2022-10-24 NOTE — Progress Notes (Signed)
Lance Matthews presents today for phlebotomy per MD orders. IVF fluid order in supportive pla. Pt declines IVF Phlebotomy procedure started at 1600 w/18 G IV set and ended at 1618. 503 grams removed. Patient refused 30 minutes obs after procedure without any incident. Patient tolerated procedure well. IV needle removed intact.

## 2022-10-25 ENCOUNTER — Telehealth: Payer: Self-pay

## 2022-10-25 NOTE — Telephone Encounter (Addendum)
Called patient and relayed message below as per Santiago Glad NP. Patient voiced full understanding.   ----- Message from Pollyann Samples sent at 10/24/2022  8:12 PM EDT ----- Please let pt know ferritin is improving, but saturation still elevated. Since he has developed very mild anemia from phlebotomy, I will space the next one out 3 weeks. I sent a schedule message.   Thanks, Clayborn Heron NP

## 2022-11-12 ENCOUNTER — Inpatient Hospital Stay: Payer: BC Managed Care – PPO

## 2022-11-12 LAB — CBC WITH DIFFERENTIAL (CANCER CENTER ONLY)
Abs Immature Granulocytes: 0.04 10*3/uL (ref 0.00–0.07)
Basophils Absolute: 0 10*3/uL (ref 0.0–0.1)
Basophils Relative: 0 %
Eosinophils Absolute: 0.1 10*3/uL (ref 0.0–0.5)
Eosinophils Relative: 1 %
HCT: 39.2 % (ref 39.0–52.0)
Hemoglobin: 14 g/dL (ref 13.0–17.0)
Immature Granulocytes: 0 %
Lymphocytes Relative: 28 %
Lymphs Abs: 2.5 10*3/uL (ref 0.7–4.0)
MCH: 33.8 pg (ref 26.0–34.0)
MCHC: 35.7 g/dL (ref 30.0–36.0)
MCV: 94.7 fL (ref 80.0–100.0)
Monocytes Absolute: 0.4 10*3/uL (ref 0.1–1.0)
Monocytes Relative: 4 %
Neutro Abs: 5.9 10*3/uL (ref 1.7–7.7)
Neutrophils Relative %: 67 %
Platelet Count: 92 10*3/uL — ABNORMAL LOW (ref 150–400)
RBC: 4.14 MIL/uL — ABNORMAL LOW (ref 4.22–5.81)
RDW: 13.6 % (ref 11.5–15.5)
WBC Count: 8.9 10*3/uL (ref 4.0–10.5)
nRBC: 0 % (ref 0.0–0.2)

## 2022-11-12 LAB — CMP (CANCER CENTER ONLY)
ALT: 45 U/L — ABNORMAL HIGH (ref 0–44)
AST: 33 U/L (ref 15–41)
Albumin: 4.5 g/dL (ref 3.5–5.0)
Alkaline Phosphatase: 157 U/L — ABNORMAL HIGH (ref 38–126)
Anion gap: 6 (ref 5–15)
BUN: 15 mg/dL (ref 6–20)
CO2: 25 mmol/L (ref 22–32)
Calcium: 9.7 mg/dL (ref 8.9–10.3)
Chloride: 108 mmol/L (ref 98–111)
Creatinine: 0.85 mg/dL (ref 0.61–1.24)
GFR, Estimated: >60 mL/min (ref >60)
Glucose, Bld: 91 mg/dL (ref 70–99)
Potassium: 3.9 mmol/L (ref 3.5–5.1)
Sodium: 139 mmol/L (ref 135–145)
Total Bilirubin: 0.4 mg/dL (ref 0.3–1.2)
Total Protein: 6.9 g/dL (ref 6.5–8.1)

## 2022-11-12 LAB — IRON AND IRON BINDING CAPACITY (CC-WL,HP ONLY)
Iron: 122 ug/dL (ref 45–182)
Saturation Ratios: 45 % — ABNORMAL HIGH (ref 17.9–39.5)
TIBC: 269 ug/dL (ref 250–450)
UIBC: 147 ug/dL (ref 117–376)

## 2022-11-12 NOTE — Progress Notes (Signed)
Lillia Carmel presents today for phlebotomy per MD orders. Phlebotomy procedure started at 1552 and ended at 1600. 512 grams removed using a 16g phlebotomy kit. Patient observed for 30 minutes after procedure without any incident. Patient tolerated procedure well. IV needle removed intact.  Patient declined orders for IV Hydration in treatment plan, nutrition and to stay 30 minutes post observation. VSS at discharge.

## 2022-11-16 ENCOUNTER — Other Ambulatory Visit (HOSPITAL_COMMUNITY): Payer: Self-pay | Admitting: Psychiatry

## 2022-11-22 ENCOUNTER — Other Ambulatory Visit (HOSPITAL_COMMUNITY): Payer: Self-pay | Admitting: Psychiatry

## 2022-11-22 ENCOUNTER — Telehealth: Payer: Self-pay

## 2022-11-22 DIAGNOSIS — F4312 Post-traumatic stress disorder, chronic: Secondary | ICD-10-CM

## 2022-11-22 NOTE — Telephone Encounter (Signed)
Medication refill request - Fax from pt's [facility] for a new Fluoxetine 20 mg order, last provided for 6 months on 05/24/22. Pt last evaluated 11/06/21 so also needs a new appointment. No current follow up scheduled.

## 2022-11-24 ENCOUNTER — Other Ambulatory Visit (HOSPITAL_COMMUNITY): Payer: Self-pay | Admitting: Psychiatry

## 2022-11-24 DIAGNOSIS — F4312 Post-traumatic stress disorder, chronic: Secondary | ICD-10-CM

## 2022-11-26 ENCOUNTER — Other Ambulatory Visit (HOSPITAL_COMMUNITY): Payer: Self-pay | Admitting: Psychiatry

## 2022-11-26 DIAGNOSIS — F4312 Post-traumatic stress disorder, chronic: Secondary | ICD-10-CM

## 2022-11-26 MED ORDER — ALPRAZOLAM 0.5 MG PO TABS
ORAL_TABLET | ORAL | 5 refills | Status: DC
Start: 2022-11-26 — End: 2023-03-28

## 2022-12-21 ENCOUNTER — Other Ambulatory Visit (HOSPITAL_COMMUNITY): Payer: Self-pay | Admitting: Psychiatry

## 2022-12-21 DIAGNOSIS — F4312 Post-traumatic stress disorder, chronic: Secondary | ICD-10-CM

## 2022-12-27 ENCOUNTER — Encounter: Payer: Self-pay | Admitting: Physician Assistant

## 2022-12-27 ENCOUNTER — Ambulatory Visit (INDEPENDENT_AMBULATORY_CARE_PROVIDER_SITE_OTHER): Payer: BC Managed Care – PPO | Admitting: Physician Assistant

## 2022-12-27 VITALS — BP 123/75 | HR 79 | Ht 74.0 in | Wt 181.0 lb

## 2022-12-27 DIAGNOSIS — F411 Generalized anxiety disorder: Secondary | ICD-10-CM | POA: Diagnosis not present

## 2022-12-27 DIAGNOSIS — F4312 Post-traumatic stress disorder, chronic: Secondary | ICD-10-CM | POA: Diagnosis not present

## 2022-12-27 DIAGNOSIS — F3341 Major depressive disorder, recurrent, in partial remission: Secondary | ICD-10-CM

## 2022-12-27 DIAGNOSIS — F5105 Insomnia due to other mental disorder: Secondary | ICD-10-CM | POA: Diagnosis not present

## 2022-12-27 DIAGNOSIS — F99 Mental disorder, not otherwise specified: Secondary | ICD-10-CM

## 2022-12-27 DIAGNOSIS — F330 Major depressive disorder, recurrent, mild: Secondary | ICD-10-CM

## 2022-12-27 MED ORDER — ZOLPIDEM TARTRATE 10 MG PO TABS
15.0000 mg | ORAL_TABLET | Freq: Every day | ORAL | 5 refills | Status: DC
Start: 2022-12-27 — End: 2023-07-21

## 2022-12-27 MED ORDER — GABAPENTIN 600 MG PO TABS: 600.0000 mg | ORAL_TABLET | Freq: Every day | ORAL | Status: DC

## 2022-12-27 MED ORDER — FLUOXETINE HCL 20 MG PO CAPS: 20.0000 mg | ORAL_CAPSULE | Freq: Every day | ORAL | Status: DC

## 2022-12-27 MED ORDER — RISPERIDONE 1 MG PO TABS
1.0000 mg | ORAL_TABLET | Freq: Every day | ORAL | Status: DC
Start: 2022-12-27 — End: 2023-03-28
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
## 2022-12-30 DIAGNOSIS — K7460 Unspecified cirrhosis of liver: Secondary | ICD-10-CM | POA: Insufficient documentation

## 2022-12-30 NOTE — Assessment & Plan Note (Signed)
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 He was found to have cirrhosis liver during cholecystectomy in 03/2022, work up showed +hemochromatosis homozygous -Baseline labs 06/24/22 showed ferritin 745 and 99% saturation. Last CBC showed normal hgb and plt 103 (2/2 to cirrhosis).  -He started therapeutic phlebotomy in April 2024, ferritin dropped to 284 on 11/12/2022 -goal is ferritin less than 45 and transferrin saturation less than 50%

## 2022-12-30 NOTE — Assessment & Plan Note (Signed)
-  Incidental finding during cholecystectomy. 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  -We recommend HCC screening with AFP and liver US q6 months.  -F/up with Dr. Levora Angel

## 2022-12-31 ENCOUNTER — Inpatient Hospital Stay: Payer: BC Managed Care – PPO

## 2022-12-31 ENCOUNTER — Encounter: Payer: Self-pay | Admitting: Hematology

## 2022-12-31 ENCOUNTER — Inpatient Hospital Stay: Payer: BC Managed Care – PPO | Attending: Nurse Practitioner | Admitting: Hematology

## 2022-12-31 DIAGNOSIS — K7469 Other cirrhosis of liver: Secondary | ICD-10-CM

## 2022-12-31 LAB — CBC WITH DIFFERENTIAL (CANCER CENTER ONLY)
Abs Immature Granulocytes: 0.03 10*3/uL (ref 0.00–0.07)
Basophils Absolute: 0 10*3/uL (ref 0.0–0.1)
Basophils Relative: 0 %
Eosinophils Absolute: 0.1 10*3/uL (ref 0.0–0.5)
Eosinophils Relative: 1 %
HCT: 43 % (ref 39.0–52.0)
Hemoglobin: 14.9 g/dL (ref 13.0–17.0)
Immature Granulocytes: 0 %
Lymphocytes Relative: 29 %
Lymphs Abs: 2.8 10*3/uL (ref 0.7–4.0)
MCH: 32.3 pg (ref 26.0–34.0)
MCHC: 34.7 g/dL (ref 30.0–36.0)
MCV: 93.1 fL (ref 80.0–100.0)
Monocytes Absolute: 0.5 10*3/uL (ref 0.1–1.0)
Monocytes Relative: 6 %
Neutro Abs: 6.2 10*3/uL (ref 1.7–7.7)
Neutrophils Relative %: 64 %
Platelet Count: 121 10*3/uL — ABNORMAL LOW (ref 150–400)
RBC: 4.62 MIL/uL (ref 4.22–5.81)
RDW: 12.8 % (ref 11.5–15.5)
WBC Count: 9.6 10*3/uL (ref 4.0–10.5)
nRBC: 0 % (ref 0.0–0.2)

## 2022-12-31 LAB — FERRITIN: Ferritin: 313 ng/mL (ref 24–336)

## 2022-12-31 LAB — IRON AND IRON BINDING CAPACITY (CC-WL,HP ONLY)
Iron: 181 ug/dL (ref 45–182)
Saturation Ratios: 66 % — ABNORMAL HIGH (ref 17.9–39.5)
TIBC: 276 ug/dL (ref 250–450)
UIBC: 95 ug/dL — ABNORMAL LOW (ref 117–376)

## 2022-12-31 NOTE — Progress Notes (Signed)
Lance Matthews presents today for phlebotomy per MD orders. Phlebotomy procedure started at 1320 and ended at 1328. 518 grams removed. Patient refused to be observed for 30 minutes after procedure Patient tolerated procedure well. IV needle removed intact. Dr Mosetta Putt wanted fluids post treatment. Patient declined!
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
## 2023-01-14 ENCOUNTER — Other Ambulatory Visit (HOSPITAL_COMMUNITY): Payer: Self-pay | Admitting: Psychiatry

## 2023-02-04 ENCOUNTER — Inpatient Hospital Stay: Payer: BC Managed Care – PPO

## 2023-02-04 ENCOUNTER — Inpatient Hospital Stay: Payer: BC Managed Care – PPO | Attending: Nurse Practitioner

## 2023-02-04 LAB — CBC WITH DIFFERENTIAL (CANCER CENTER ONLY)
Abs Immature Granulocytes: 0.04 10*3/uL (ref 0.00–0.07)
Basophils Absolute: 0 10*3/uL (ref 0.0–0.1)
Basophils Relative: 0 %
Eosinophils Absolute: 0.1 10*3/uL (ref 0.0–0.5)
Eosinophils Relative: 1 %
HCT: 41.3 % (ref 39.0–52.0)
Hemoglobin: 14.3 g/dL (ref 13.0–17.0)
Immature Granulocytes: 0 %
Lymphocytes Relative: 33 %
Lymphs Abs: 2.9 10*3/uL (ref 0.7–4.0)
MCH: 32.4 pg (ref 26.0–34.0)
MCHC: 34.6 g/dL (ref 30.0–36.0)
MCV: 93.7 fL (ref 80.0–100.0)
Monocytes Absolute: 0.4 10*3/uL (ref 0.1–1.0)
Monocytes Relative: 5 %
Neutro Abs: 5.4 10*3/uL (ref 1.7–7.7)
Neutrophils Relative %: 61 %
Platelet Count: 125 10*3/uL — ABNORMAL LOW (ref 150–400)
RBC: 4.41 MIL/uL (ref 4.22–5.81)
RDW: 13.3 % (ref 11.5–15.5)
WBC Count: 8.9 10*3/uL (ref 4.0–10.5)
nRBC: 0 % (ref 0.0–0.2)

## 2023-02-04 LAB — IRON AND IRON BINDING CAPACITY (CC-WL,HP ONLY)
Iron: 143 ug/dL (ref 45–182)
Saturation Ratios: 51 % — ABNORMAL HIGH (ref 17.9–39.5)
TIBC: 283 ug/dL (ref 250–450)
UIBC: 140 ug/dL (ref 117–376)

## 2023-02-04 NOTE — Progress Notes (Signed)
Patient here for his therapeutic phlebotomy- per Dr. Mosetta Putt use the labs from 12/31/22. Patient hemoglobin today is 14.3. 16g needle set used in his R AC- 505grams removed from 1610-1618. Patient feeling "fine". Refused IVF and 30 minute observation. VSS-  BP 126/79 (BP Location: Right Arm, Patient Position: Sitting)   Pulse 76   Temp 98 F (36.7 C) (Oral)   Resp 16   SpO2 100%   Ambulatory to the lobby.

## 2023-02-05 LAB — FERRITIN: Ferritin: 238 ng/mL (ref 24–336)

## 2023-02-07 ENCOUNTER — Other Ambulatory Visit: Payer: Self-pay

## 2023-02-07 MED ORDER — GABAPENTIN 600 MG PO TABS: 600.0000 mg | ORAL_TABLET | Freq: Every day | ORAL | 0 refills | Status: DC

## 2023-02-18 ENCOUNTER — Inpatient Hospital Stay: Payer: BC Managed Care – PPO

## 2023-02-18 ENCOUNTER — Encounter: Payer: Self-pay | Admitting: Nurse Practitioner

## 2023-02-18 ENCOUNTER — Inpatient Hospital Stay: Payer: BC Managed Care – PPO | Attending: Nurse Practitioner

## 2023-02-18 DIAGNOSIS — F17210 Nicotine dependence, cigarettes, uncomplicated: Secondary | ICD-10-CM | POA: Insufficient documentation

## 2023-02-18 DIAGNOSIS — K7460 Unspecified cirrhosis of liver: Secondary | ICD-10-CM | POA: Insufficient documentation

## 2023-02-18 LAB — CBC WITH DIFFERENTIAL (CANCER CENTER ONLY)
Abs Immature Granulocytes: 0.01 10*3/uL (ref 0.00–0.07)
Basophils Absolute: 0 10*3/uL (ref 0.0–0.1)
Basophils Relative: 1 %
Eosinophils Absolute: 0.1 10*3/uL (ref 0.0–0.5)
Eosinophils Relative: 1 %
HCT: 37 % — ABNORMAL LOW (ref 39.0–52.0)
Hemoglobin: 13.1 g/dL (ref 13.0–17.0)
Immature Granulocytes: 0 %
Lymphocytes Relative: 30 %
Lymphs Abs: 2 10*3/uL (ref 0.7–4.0)
MCH: 33.8 pg (ref 26.0–34.0)
MCHC: 35.4 g/dL (ref 30.0–36.0)
MCV: 95.4 fL (ref 80.0–100.0)
Monocytes Absolute: 0.3 10*3/uL (ref 0.1–1.0)
Monocytes Relative: 4 %
Neutro Abs: 4.3 10*3/uL (ref 1.7–7.7)
Neutrophils Relative %: 64 %
Platelet Count: 115 10*3/uL — ABNORMAL LOW (ref 150–400)
RBC: 3.88 MIL/uL — ABNORMAL LOW (ref 4.22–5.81)
RDW: 14.5 % (ref 11.5–15.5)
WBC Count: 6.7 10*3/uL (ref 4.0–10.5)
nRBC: 0 % (ref 0.0–0.2)

## 2023-02-18 LAB — IRON AND IRON BINDING CAPACITY (CC-WL,HP ONLY)
Iron: 173 ug/dL (ref 45–182)
Saturation Ratios: 71 % — ABNORMAL HIGH (ref 17.9–39.5)
TIBC: 245 ug/dL — ABNORMAL LOW (ref 250–450)
UIBC: 72 ug/dL — ABNORMAL LOW (ref 117–376)

## 2023-02-18 LAB — FERRITIN: Ferritin: 169 ng/mL (ref 24–336)

## 2023-02-18 NOTE — Progress Notes (Signed)
Lance Matthews presents today for phlebotomy per MD orders. Phlebotomy procedure started at 1445 and ended at 1500. 525 grams removed. Patient declined post procedure observation, declined IVF. Patient tolerated procedure well without incident. IV needle removed intact. VSS at discharge, ambulated to lobby.

## 2023-02-18 NOTE — Patient Instructions (Signed)

## 2023-02-18 NOTE — Progress Notes (Signed)
OK to proceed with phlebotomy with Hgb 13.1 per Dr. Mosetta Putt.

## 2023-02-19 LAB — AFP TUMOR MARKER: AFP, Serum, Tumor Marker: 2.6 ng/mL (ref 0.0–6.9)

## 2023-02-20 ENCOUNTER — Other Ambulatory Visit: Payer: Self-pay | Admitting: Gastroenterology

## 2023-02-20 DIAGNOSIS — K7469 Other cirrhosis of liver: Secondary | ICD-10-CM

## 2023-02-25 ENCOUNTER — Ambulatory Visit
Admission: RE | Admit: 2023-02-25 | Discharge: 2023-02-25 | Disposition: A | Discharge status: Home / Self Care | Payer: BC Managed Care – PPO | Source: Ambulatory Visit | Attending: Gastroenterology | Admitting: Gastroenterology

## 2023-02-25 DIAGNOSIS — K7469 Other cirrhosis of liver: Secondary | ICD-10-CM
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
## 2023-03-03 ENCOUNTER — Other Ambulatory Visit: Payer: Self-pay

## 2023-03-03 ENCOUNTER — Encounter: Payer: Self-pay | Admitting: Nurse Practitioner

## 2023-03-03 ENCOUNTER — Inpatient Hospital Stay: Payer: BC Managed Care – PPO | Admitting: Nurse Practitioner

## 2023-03-03 ENCOUNTER — Inpatient Hospital Stay: Payer: BC Managed Care – PPO

## 2023-03-03 DIAGNOSIS — K7469 Other cirrhosis of liver: Secondary | ICD-10-CM

## 2023-03-03 LAB — IRON AND IRON BINDING CAPACITY (CC-WL,HP ONLY)
Iron: 165 ug/dL (ref 45–182)
Saturation Ratios: 53 % — ABNORMAL HIGH (ref 17.9–39.5)
TIBC: 311 ug/dL (ref 250–450)
UIBC: 146 ug/dL (ref 117–376)

## 2023-03-03 LAB — CBC WITH DIFFERENTIAL (CANCER CENTER ONLY)
Abs Immature Granulocytes: 0.05 10*3/uL (ref 0.00–0.07)
Basophils Absolute: 0 10*3/uL (ref 0.0–0.1)
Basophils Relative: 1 %
Eosinophils Absolute: 0.1 10*3/uL (ref 0.0–0.5)
Eosinophils Relative: 1 %
HCT: 39.5 % (ref 39.0–52.0)
Hemoglobin: 13.8 g/dL (ref 13.0–17.0)
Immature Granulocytes: 1 %
Lymphocytes Relative: 33 %
Lymphs Abs: 2.8 10*3/uL (ref 0.7–4.0)
MCH: 33.8 pg (ref 26.0–34.0)
MCHC: 34.9 g/dL (ref 30.0–36.0)
MCV: 96.8 fL (ref 80.0–100.0)
Monocytes Absolute: 0.5 10*3/uL (ref 0.1–1.0)
Monocytes Relative: 5 %
Neutro Abs: 5.1 10*3/uL (ref 1.7–7.7)
Neutrophils Relative %: 59 %
Platelet Count: 109 10*3/uL — ABNORMAL LOW (ref 150–400)
RBC: 4.08 MIL/uL — ABNORMAL LOW (ref 4.22–5.81)
RDW: 14.8 % (ref 11.5–15.5)
WBC Count: 8.6 10*3/uL (ref 4.0–10.5)
nRBC: 0 % (ref 0.0–0.2)

## 2023-03-03 LAB — FERRITIN: Ferritin: 164 ng/mL (ref 24–336)

## 2023-03-03 MED ORDER — SODIUM CHLORIDE 0.9 % IV SOLN: INTRAVENOUS | Status: DC

## 2023-03-24 ENCOUNTER — Inpatient Hospital Stay: Payer: BC Managed Care – PPO

## 2023-03-24 ENCOUNTER — Inpatient Hospital Stay: Payer: BC Managed Care – PPO | Attending: Nurse Practitioner

## 2023-03-24 ENCOUNTER — Other Ambulatory Visit: Payer: Self-pay | Admitting: Hematology

## 2023-03-24 LAB — IRON AND IRON BINDING CAPACITY (CC-WL,HP ONLY)
Iron: 134 ug/dL (ref 45–182)
Saturation Ratios: 48 % — ABNORMAL HIGH (ref 17.9–39.5)
TIBC: 280 ug/dL (ref 250–450)
UIBC: 146 ug/dL (ref 117–376)

## 2023-03-24 LAB — CBC WITH DIFFERENTIAL (CANCER CENTER ONLY)
Abs Immature Granulocytes: 0.02 10*3/uL (ref 0.00–0.07)
Basophils Absolute: 0 10*3/uL (ref 0.0–0.1)
Basophils Relative: 0 %
Eosinophils Absolute: 0.1 10*3/uL (ref 0.0–0.5)
Eosinophils Relative: 1 %
HCT: 41.3 % (ref 39.0–52.0)
Hemoglobin: 14 g/dL (ref 13.0–17.0)
Immature Granulocytes: 0 %
Lymphocytes Relative: 24 %
Lymphs Abs: 2.2 10*3/uL (ref 0.7–4.0)
MCH: 33 pg (ref 26.0–34.0)
MCHC: 33.9 g/dL (ref 30.0–36.0)
MCV: 97.4 fL (ref 80.0–100.0)
Monocytes Absolute: 0.5 10*3/uL (ref 0.1–1.0)
Monocytes Relative: 5 %
Neutro Abs: 6.4 10*3/uL (ref 1.7–7.7)
Neutrophils Relative %: 70 %
Platelet Count: 114 10*3/uL — ABNORMAL LOW (ref 150–400)
RBC: 4.24 MIL/uL (ref 4.22–5.81)
RDW: 13.6 % (ref 11.5–15.5)
WBC Count: 9.2 10*3/uL (ref 4.0–10.5)
nRBC: 0 % (ref 0.0–0.2)

## 2023-03-24 LAB — FERRITIN: Ferritin: 100 ng/mL (ref 24–336)

## 2023-03-24 NOTE — Progress Notes (Signed)
Lance Matthews presents today for phlebotomy per MD orders. Previous Ferritin 164, Previous Sat 53. Hgb 14. Phlebotomy procedure started at 1002 and ended at 1009. 507 grams removed via 16G RAC Patient declined to be observed for 30 minutes after procedure. Patient tolerated procedure well. VSS. IV needle removed intact. Ambulated independently to lobby.

## 2023-03-24 NOTE — Patient Instructions (Signed)

## 2023-03-28 ENCOUNTER — Encounter: Payer: Self-pay | Admitting: Physician Assistant

## 2023-03-28 ENCOUNTER — Ambulatory Visit: Payer: BC Managed Care – PPO | Admitting: Physician Assistant

## 2023-03-28 DIAGNOSIS — F4312 Post-traumatic stress disorder, chronic: Secondary | ICD-10-CM | POA: Diagnosis not present

## 2023-03-28 DIAGNOSIS — F411 Generalized anxiety disorder: Secondary | ICD-10-CM | POA: Diagnosis not present

## 2023-03-28 DIAGNOSIS — F331 Major depressive disorder, recurrent, moderate: Secondary | ICD-10-CM | POA: Diagnosis not present

## 2023-03-28 DIAGNOSIS — R45851 Suicidal ideations: Secondary | ICD-10-CM

## 2023-03-28 MED ORDER — RISPERIDONE 1 MG PO TABS: 1.0000 mg | ORAL_TABLET | Freq: Every day | ORAL | 5 refills | Status: DC

## 2023-03-28 MED ORDER — ALPRAZOLAM 1 MG PO TABS: 1.0000 mg | ORAL_TABLET | Freq: Two times a day (BID) | ORAL | 1 refills | Status: DC | PRN

## 2023-03-28 MED ORDER — GABAPENTIN 600 MG PO TABS: 600.0000 mg | ORAL_TABLET | Freq: Every day | ORAL | 5 refills | Status: DC

## 2023-03-28 MED ORDER — FLUOXETINE HCL 40 MG PO CAPS: 40.0000 mg | ORAL_CAPSULE | Freq: Every day | ORAL | 1 refills | Status: DC

## 2023-03-28 MED ORDER — LITHIUM CARBONATE 150 MG PO CAPS: 150.0000 mg | ORAL_CAPSULE | Freq: Every evening | ORAL | 1 refills | Status: DC

## 2023-03-28 NOTE — Progress Notes (Signed)
[facility]  Patient ID: Lance Matthews,  MRN: [id number]  PCP: Farris Has, MD  Date of Evaluation: 03/28/2023 Time spent:30 minutes  Chief Complaint:  Chief Complaint   Anxiety; Depression; Insomnia; Follow-up    HISTORY/CURRENT STATUS: HPI For 3 month med check.  Patient has been much more depressed and anxious lately.  No known trigger.  He starts feeling anxious out of nowhere.  He will start to get panicky, feels nervous on the inside, gets a little short of breath as well.  No palpitations or chest pain.  He tries to use the tools he has learned in counseling to help, which is sometimes effective.  Xanax is effective but not enough.  It only takes the edge off for a short while.  The depression is worse too.  He has a hard time enjoying things.  Energy and motivation are fair to good depending on the situation.  ADLs and personal hygiene are normal.  Appetite is normal and weight is stable.  States he feels "down" for no reason.  He has had passive suicidal thoughts off and on for the past month or so.  He does not have a plan.  It is more of a "not wanting to be here type of thing."  He has suffered from depression most of his life and he is tired of it.  No homicidal thoughts.  Patient denies increased energy with decreased need for sleep, increased talkativeness, racing thoughts, impulsivity or risky behaviors, increased spending, increased libido, grandiosity, increased irritability or anger, paranoia, or hallucinations.  Denies dizziness, syncope, seizures, numbness, tingling, tremor, tics, unsteady gait, slurred speech, confusion. Denies muscle or joint pain, stiffness, or dystonia. Denies unexplained weight loss, frequent infections, or sores that heal slowly.  No polyphagia, polydipsia, or polyuria. Denies visual changes or paresthesias.   Individual Medical History/ Review of Systems: Changes? :No   Past medications for mental health diagnoses  include: Chantix, Ambien, Prozac, Zquil, Paxil, Abilify, Gabapentin, Risperdal, Ambien  Has been pt at [facility] Has been inpt for a month or so at [facility]. He was around 52 yo.   Cut his arms a few times as a kid 'to get attn'  Allergies: Patient has no known allergies.  Current Medications:  Current Outpatient Medications:    ALPRAZolam (XANAX) 1 MG tablet, Take 1 tablet (1 mg total) by mouth 2 (two) times daily as needed for anxiety., Disp: 60 tablet, Rfl: 1   FLUoxetine (PROZAC) 40 MG capsule, Take 1 capsule (40 mg total) by mouth daily., Disp: 30 capsule, Rfl: 1   ibuprofen (ADVIL,MOTRIN) 200 MG tablet, Take 200-600 mg by mouth every 6 (six) hours as needed for headache or moderate pain., Disp: , Rfl:    lithium carbonate 150 MG capsule, Take 1 capsule (150 mg total) by mouth at bedtime., Disp: 30 capsule, Rfl: 1   sildenafil (VIAGRA) 50 MG tablet, Take 1 tablet (50 mg total) by mouth daily as needed for erectile dysfunction., Disp: 10 tablet, Rfl: 5   zolpidem (AMBIEN) 10 MG tablet, Take 1.5 tablets (15 mg total) by mouth at bedtime., Disp: 45 tablet, Rfl: 5   gabapentin (NEURONTIN) 600 MG tablet, Take 1 tablet (600 mg total) by mouth at bedtime., Disp: 30 tablet, Rfl: 5   omeprazole (PRILOSEC) 10 MG capsule, Take 10 mg by mouth daily. (Patient not taking: Reported on 03/28/2023), Disp: , Rfl:    risperiDONE (RISPERDAL) 1 MG tablet, Take 1 tablet (1 mg total) by mouth at  bedtime., Disp: 30 tablet, Rfl: 5 Medication Side Effects: none  Family Medical/ Social History: Changes? No  MENTAL HEALTH EXAM:  There were no vitals taken for this visit.There is no height or weight on file to calculate BMI.  General Appearance: Casual and Well Groomed  Eye Contact:  Good  Speech:  Clear and Coherent and Normal Rate  Volume:  Normal  Mood:  Depressed  Affect:  Depressed  Thought Process:  Goal Directed and Descriptions of Associations: Circumstantial  Orientation:  Full (Time,  Place, and Person)  Thought Content: Logical   Suicidal Thoughts:  Yes.  without intent/plan  Homicidal Thoughts:  No  Memory:  WNL  Judgement:  Good  Insight:  Good  Psychomotor Activity:  Normal  Concentration:  Concentration: Good  Recall:  Good  Fund of Knowledge: Good  Language: Good  Assets:  Communication Skills Desire for Improvement Financial Resources/Insurance Housing Transportation  ADL's:  Intact  Cognition: WNL  Prognosis:  Good   01/22/2023 lab results from [place] [facility] from 01/08/2023, hemoglobin A1c 5.7 CMP completely normal glucose 84 alk phos slightly elevated at 149 No Lipid panel   DIAGNOSES:    ICD-10-CM   1. Major depressive disorder, recurrent episode, moderate (HCC)  F33.1     2. Post-traumatic stress disorder, chronic  F43.12 risperiDONE (RISPERDAL) 1 MG tablet    3. Generalized anxiety disorder  F41.1     4. Suicidal ideation  R45.851      Receiving Psychotherapy: Yes with Danae Orleans, [facility]  RECOMMENDATIONS:  PDMP reviewed.  Xanax filled 03/09/2023.  Ambien filled 03/09/2023. I provided 30 minutes of face to face time during this encounter, including time spent before and after the visit in records review, medical decision making, counseling pertinent to today's visit, and charting.   We discussed the depression and increased anxiety.  I recommend increasing the dose of Xanax and Prozac.  Pros and cons discussed with him and he agrees to the increase.  I also recommend adding low-dose lithium, this can help quickly for suicidal thoughts.  Benefits, risk and side effects were discussed that he accepts.  Contract for safety in place. Call the office on-call service, 988/hotline, 911, or present to [facility] or ER if any life-threatening psychiatric crisis. Patient verbalizes understanding.   Increase Xanax to 1 mg, 1 p.o. twice daily as needed. Increase Prozac to 40 mg daily. Continue gabapentin 600 mg nightly. Start Lithium 150  mg, 1 at bedtime. Continue Risperdal 1 mg, 1 nightly. Continue Ambien 10 mg, 1.5 pills nightly as needed.  This is higher than recommended dose however he has been on this for many years so I am not decreasing it. Continue therapy with Danae Orleans, [facility]. Return in 4 to 6 weeks.  Melony Overly, PA-C

## 2023-04-14 ENCOUNTER — Inpatient Hospital Stay: Payer: BC Managed Care – PPO

## 2023-04-14 LAB — CBC WITH DIFFERENTIAL (CANCER CENTER ONLY)
Abs Immature Granulocytes: 0.01 10*3/uL (ref 0.00–0.07)
Basophils Absolute: 0.1 10*3/uL (ref 0.0–0.1)
Basophils Relative: 1 %
Eosinophils Absolute: 0.1 10*3/uL (ref 0.0–0.5)
Eosinophils Relative: 1 %
HCT: 42.4 % (ref 39.0–52.0)
Hemoglobin: 14.8 g/dL (ref 13.0–17.0)
Immature Granulocytes: 0 %
Lymphocytes Relative: 27 %
Lymphs Abs: 2.2 10*3/uL (ref 0.7–4.0)
MCH: 33.7 pg (ref 26.0–34.0)
MCHC: 34.9 g/dL (ref 30.0–36.0)
MCV: 96.6 fL (ref 80.0–100.0)
Monocytes Absolute: 0.5 10*3/uL (ref 0.1–1.0)
Monocytes Relative: 6 %
Neutro Abs: 5.1 10*3/uL (ref 1.7–7.7)
Neutrophils Relative %: 65 %
Platelet Count: 110 10*3/uL — ABNORMAL LOW (ref 150–400)
RBC: 4.39 MIL/uL (ref 4.22–5.81)
RDW: 12.8 % (ref 11.5–15.5)
WBC Count: 7.9 10*3/uL (ref 4.0–10.5)
nRBC: 0 % (ref 0.0–0.2)

## 2023-04-14 LAB — IRON AND IRON BINDING CAPACITY (CC-WL,HP ONLY)
Iron: 128 ug/dL (ref 45–182)
Saturation Ratios: 45 % — ABNORMAL HIGH (ref 17.9–39.5)
TIBC: 283 ug/dL (ref 250–450)
UIBC: 155 ug/dL (ref 117–376)

## 2023-04-14 LAB — FERRITIN: Ferritin: 114 ng/mL (ref 24–336)

## 2023-04-14 NOTE — Progress Notes (Signed)
Lillia Carmel presents today for phlebotomy per MD orders.  First attempt at phlebotomy(905) to [facility] caused shooting pain to right thumb. Patient reported he was unable to keep fist tightened. Procedure was terminated and reattempted at a different location after pain resolved.  Phlebotomy procedure started at 932 and ended at 938. 510 grams removed using a 16g phlebotomy kit to the R AC.   Patient declined to be observed for 30 minutes after procedure and declined IVF post procedure per treatment plan. Patient tolerated procedure well. IV needle removed intact.   VSS at discharged patient advised to contact the office with any concerns or return of pain to arm following procedure.

## 2023-04-14 NOTE — Patient Instructions (Signed)

## 2023-05-04 NOTE — Assessment & Plan Note (Signed)
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 He was found to have cirrhosis liver during cholecystectomy in 03/2022, work up showed +hemochromatosis homozygous -Baseline labs 06/24/22 showed ferritin 745 and 99% saturation. Last CBC showed normal hgb and plt 103 (2/2 to cirrhosis).  -He started therapeutic phlebotomy in April 2024, ferritin dropped to 284 on 11/12/2022 -goal is ferritin less than 45 and transferrin saturation less than 50%

## 2023-05-05 ENCOUNTER — Inpatient Hospital Stay: Payer: BC Managed Care – PPO

## 2023-05-05 ENCOUNTER — Encounter: Payer: Self-pay | Admitting: Hematology

## 2023-05-05 ENCOUNTER — Inpatient Hospital Stay: Payer: BC Managed Care – PPO | Attending: Nurse Practitioner | Admitting: Hematology

## 2023-05-05 ENCOUNTER — Inpatient Hospital Stay: Payer: BC Managed Care – PPO | Attending: Nurse Practitioner

## 2023-05-05 DIAGNOSIS — K760 Fatty (change of) liver, not elsewhere classified: Secondary | ICD-10-CM | POA: Diagnosis not present

## 2023-05-05 LAB — CBC WITH DIFFERENTIAL (CANCER CENTER ONLY)
Abs Immature Granulocytes: 0.03 10*3/uL (ref 0.00–0.07)
Basophils Absolute: 0.1 10*3/uL (ref 0.0–0.1)
Basophils Relative: 1 %
Eosinophils Absolute: 0.1 10*3/uL (ref 0.0–0.5)
Eosinophils Relative: 1 %
HCT: 42 % (ref 39.0–52.0)
Hemoglobin: 14.4 g/dL (ref 13.0–17.0)
Immature Granulocytes: 0 %
Lymphocytes Relative: 31 %
Lymphs Abs: 2.7 10*3/uL (ref 0.7–4.0)
MCH: 32.7 pg (ref 26.0–34.0)
MCHC: 34.3 g/dL (ref 30.0–36.0)
MCV: 95.2 fL (ref 80.0–100.0)
Monocytes Absolute: 0.5 10*3/uL (ref 0.1–1.0)
Monocytes Relative: 5 %
Neutro Abs: 5.5 10*3/uL (ref 1.7–7.7)
Neutrophils Relative %: 62 %
Platelet Count: 115 10*3/uL — ABNORMAL LOW (ref 150–400)
RBC: 4.41 MIL/uL (ref 4.22–5.81)
RDW: 12.7 % (ref 11.5–15.5)
WBC Count: 8.9 10*3/uL (ref 4.0–10.5)
nRBC: 0 % (ref 0.0–0.2)

## 2023-05-05 LAB — FERRITIN: Ferritin: 75 ng/mL (ref 24–336)

## 2023-05-05 LAB — IRON AND IRON BINDING CAPACITY (CC-WL,HP ONLY)
Iron: 219 ug/dL — ABNORMAL HIGH (ref 45–182)
Saturation Ratios: 78 % — ABNORMAL HIGH (ref 17.9–39.5)
TIBC: 281 ug/dL (ref 250–450)
UIBC: 62 ug/dL — ABNORMAL LOW (ref 117–376)

## 2023-05-05 NOTE — Patient Instructions (Signed)

## 2023-05-05 NOTE — Progress Notes (Signed)
Lance Matthews presents today for phlebotomy per MD orders. Phlebotomy procedure started at 0932 and ended at 0938. 509 grams removed. Patient declined post observation. Patient tolerated procedure well without incident. IV needle removed intact.
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
## 2023-05-13 ENCOUNTER — Ambulatory Visit: Payer: BC Managed Care – PPO | Admitting: Physician Assistant

## 2023-05-13 ENCOUNTER — Encounter: Payer: Self-pay | Admitting: Physician Assistant

## 2023-05-13 DIAGNOSIS — F411 Generalized anxiety disorder: Secondary | ICD-10-CM

## 2023-05-13 DIAGNOSIS — F331 Major depressive disorder, recurrent, moderate: Secondary | ICD-10-CM | POA: Diagnosis not present

## 2023-05-13 DIAGNOSIS — F99 Mental disorder, not otherwise specified: Secondary | ICD-10-CM

## 2023-05-13 DIAGNOSIS — F4312 Post-traumatic stress disorder, chronic: Secondary | ICD-10-CM

## 2023-05-13 DIAGNOSIS — F5105 Insomnia due to other mental disorder: Secondary | ICD-10-CM | POA: Diagnosis not present

## 2023-05-13 MED ORDER — FLUOXETINE HCL 20 MG PO CAPS: 60.0000 mg | ORAL_CAPSULE | Freq: Every day | ORAL | 1 refills | Status: DC

## 2023-05-13 NOTE — Progress Notes (Signed)
[facility]  Patient ID: Lance Matthews,  MRN: [id number]  PCP: Farris Has, MD  Date of Evaluation: 05/13/2023 Time spent:25 minutes  Chief Complaint:  Chief Complaint   Depression; Anxiety    HISTORY/CURRENT STATUS: HPI For 6 week med check.  Prozac was increased and Lithium was added to help depression and SI. The SI are not nearly as bad.  He does feel like he has been dealing with this so long though that "what is the use."  He has no plans for suicide.  He is working part-time now, as a Hospital doctor.  He is not missing work.  He is extremely anxious to go out of the house though.  He wants to work and do things but the anxiety can be crippling.  He does not take the Xanax unless he is off work though so he suffers through the anxiety.  He will get panicky, his palms will be sweaty his hands will shake and he has palpitations occasionally.  The sweating and shaking hands can last several hours.  The palpitations come and go quickly.  Because of the depression, he does not enjoy much of anything.  Energy and motivation are good though.  He sleeps well as long as he takes the Ambien.  ADLs and personal hygiene are normal.  Appetite is normal and weight is stable.  He does not cry easily.  No suicidal or homicidal thoughts at this time.  Patient denies increased energy with decreased need for sleep, increased talkativeness, racing thoughts, impulsivity or risky behaviors, increased spending, increased libido, grandiosity, increased irritability or anger, paranoia, or hallucinations.  Denies dizziness, syncope, seizures, numbness, tingling, tremor, tics, unsteady gait, slurred speech, confusion. Denies muscle or joint pain, stiffness, or dystonia. Denies unexplained weight loss, frequent infections, or sores that heal slowly.  No polyphagia, polydipsia, or polyuria. Denies visual changes or paresthesias.   Individual Medical History/ Review of Systems: Changes? :No   Past  medications for mental health diagnoses include: Chantix, Ambien, Prozac, Zquil, Paxil, Abilify, Gabapentin, Risperdal, Ambien  Has been pt at [facility] Has been inpt for a month or so at [facility]. He was around 52 yo.   Cut his arms a few times as a kid 'to get attn'  Allergies: Patient has no known allergies.  Current Medications:  Current Outpatient Medications:    ALPRAZolam (XANAX) 1 MG tablet, Take 1 tablet (1 mg total) by mouth 2 (two) times daily as needed for anxiety., Disp: 60 tablet, Rfl: 1   FLUoxetine (PROZAC) 20 MG capsule, Take 3 capsules (60 mg total) by mouth daily., Disp: 90 capsule, Rfl: 1   gabapentin (NEURONTIN) 600 MG tablet, Take 1 tablet (600 mg total) by mouth at bedtime., Disp: 30 tablet, Rfl: 5   ibuprofen (ADVIL,MOTRIN) 200 MG tablet, Take 200-600 mg by mouth every 6 (six) hours as needed for headache or moderate pain., Disp: , Rfl:    lithium carbonate 150 MG capsule, Take 1 capsule (150 mg total) by mouth at bedtime., Disp: 30 capsule, Rfl: 1   risperiDONE (RISPERDAL) 1 MG tablet, Take 1 tablet (1 mg total) by mouth at bedtime., Disp: 30 tablet, Rfl: 5   sildenafil (VIAGRA) 50 MG tablet, Take 1 tablet (50 mg total) by mouth daily as needed for erectile dysfunction., Disp: 10 tablet, Rfl: 5   zolpidem (AMBIEN) 10 MG tablet, Take 1.5 tablets (15 mg total) by mouth at bedtime., Disp: 45 tablet, Rfl: 5   omeprazole (PRILOSEC) 10 MG capsule,  Take 10 mg by mouth daily. (Patient not taking: Reported on 12/27/2022), Disp: , Rfl:  Medication Side Effects: none  Family Medical/ Social History: Changes? No  MENTAL HEALTH EXAM:  There were no vitals taken for this visit.There is no height or weight on file to calculate BMI.  General Appearance: Casual and Well Groomed  Eye Contact:  Good  Speech:  Clear and Coherent and Normal Rate  Volume:  Normal  Mood:  Anxious and Depressed  Affect:  Depressed and Anxious  Thought Process:  Goal Directed and  Descriptions of Associations: Circumstantial  Orientation:  Full (Time, Place, and Person)  Thought Content: Logical   Suicidal Thoughts:  No  Homicidal Thoughts:  No  Memory:  WNL  Judgement:  Good  Insight:  Good  Psychomotor Activity:  Normal  Concentration:  Concentration: Good  Recall:  Good  Fund of Knowledge: Good  Language: Good  Assets:  Communication Skills Desire for Improvement Financial Resources/Insurance Housing Transportation  ADL's:  Intact  Cognition: WNL  Prognosis:  Good   DIAGNOSES:    ICD-10-CM   1. Generalized anxiety disorder  F41.1     2. Post-traumatic stress disorder, chronic  F43.12     3. Major depressive disorder, recurrent episode, moderate (HCC)  F33.1     4. Insomnia due to other mental disorder  F51.05    F99       Receiving Psychotherapy: Yes  with Danae Orleans, [facility] [address], weekly  RECOMMENDATIONS:  PDMP reviewed.  Gabapentin filled 05/03/2023.  Xanax filled 04/22/2023.  Ambien filled 04/08/2023. I provided 25 minutes of face to face time during this encounter, including time spent before and after the visit in records review, medical decision making, counseling pertinent to today's visit, and charting.   I think the lithium has helped with the passive suicidal thoughts.  I recommend continuing that.  I considered increasing the dose, but that might be worsening the shakiness in his hands, I feel that is due to anxiety though. Options for anxiety prevention would be to add gabapentin in the morning and around lunch, along with his evening dose.  We are both concerned about drowsiness though and that he cannot drive if that happens.  Another option would be adding BuSpar.  I prefer not to add another medication and right now if we do not have to.  We discussed benefits, risk and side effects though so if he is not seeing any improvement in the anxiety in approximately 3 weeks or so, he can call and I will send in BuSpar prescription.  I recommend  increasing the Prozac even more.  Pros and cons were discussed and he would like to try it.  Continue Xanax 1 mg, 1 p.o. twice daily as needed. Increase Prozac to 60 mg daily. Continue gabapentin 600 mg nightly. Continue lithium 150 mg, 1 at bedtime. Continue Risperdal 1 mg, 1 nightly. Continue Ambien 10 mg, 1.5 pills nightly as needed.  This is higher than recommended dose however he has been on this for many years so I am not decreasing it. Continue therapy with Danae Orleans, [facility]. Return in 4 to 6 weeks.  Melony Overly, PA-C

## 2023-05-16 ENCOUNTER — Other Ambulatory Visit (HOSPITAL_COMMUNITY): Payer: Self-pay | Admitting: Psychiatry

## 2023-05-17 ENCOUNTER — Other Ambulatory Visit (HOSPITAL_COMMUNITY): Payer: Self-pay | Admitting: Psychiatry

## 2023-05-22 ENCOUNTER — Other Ambulatory Visit: Payer: Self-pay | Admitting: Physician Assistant

## 2023-05-23 ENCOUNTER — Other Ambulatory Visit: Payer: Self-pay | Admitting: Physician Assistant

## 2023-05-26 ENCOUNTER — Inpatient Hospital Stay: Payer: BC Managed Care – PPO

## 2023-05-26 ENCOUNTER — Telehealth: Payer: Self-pay | Admitting: Hematology

## 2023-05-26 NOTE — Telephone Encounter (Signed)
 Canceled appointments per 2/10 staff message. Called the patient to reschedule appointments and he stated he did not want to reschedule. He wanted to just wait until 3/3 appointments.

## 2023-06-16 ENCOUNTER — Inpatient Hospital Stay: Payer: BC Managed Care – PPO

## 2023-06-16 ENCOUNTER — Inpatient Hospital Stay: Payer: BC Managed Care – PPO | Attending: Nurse Practitioner

## 2023-06-16 LAB — IRON AND IRON BINDING CAPACITY (CC-WL,HP ONLY)
Iron: 130 ug/dL (ref 45–182)
Saturation Ratios: 44 % — ABNORMAL HIGH (ref 17.9–39.5)
TIBC: 293 ug/dL (ref 250–450)
UIBC: 163 ug/dL (ref 117–376)

## 2023-06-16 LAB — CBC WITH DIFFERENTIAL (CANCER CENTER ONLY)
Abs Immature Granulocytes: 0.02 10*3/uL (ref 0.00–0.07)
Basophils Absolute: 0 10*3/uL (ref 0.0–0.1)
Basophils Relative: 0 %
Eosinophils Absolute: 0.1 10*3/uL (ref 0.0–0.5)
Eosinophils Relative: 1 %
HCT: 40.7 % (ref 39.0–52.0)
Hemoglobin: 13.8 g/dL (ref 13.0–17.0)
Immature Granulocytes: 0 %
Lymphocytes Relative: 37 %
Lymphs Abs: 2.5 10*3/uL (ref 0.7–4.0)
MCH: 31.9 pg (ref 26.0–34.0)
MCHC: 33.9 g/dL (ref 30.0–36.0)
MCV: 94 fL (ref 80.0–100.0)
Monocytes Absolute: 0.3 10*3/uL (ref 0.1–1.0)
Monocytes Relative: 4 %
Neutro Abs: 3.9 10*3/uL (ref 1.7–7.7)
Neutrophils Relative %: 58 %
Platelet Count: 125 10*3/uL — ABNORMAL LOW (ref 150–400)
RBC: 4.33 MIL/uL (ref 4.22–5.81)
RDW: 13.2 % (ref 11.5–15.5)
WBC Count: 6.8 10*3/uL (ref 4.0–10.5)
nRBC: 0 % (ref 0.0–0.2)

## 2023-06-16 LAB — FERRITIN: Ferritin: 85 ng/mL (ref 24–336)

## 2023-06-16 NOTE — Progress Notes (Signed)
 Lance Matthews presents today for phlebotomy per MD orders.  Phlebotomy procedure started at 1543 and ended at 1548. 515 grams removed. Patient declined IV fluids & post procedure observation. Patient tolerated procedure well. IV needle removed intact. VS WNL.

## 2023-06-18 ENCOUNTER — Encounter: Payer: Self-pay | Admitting: Nurse Practitioner

## 2023-06-22 ENCOUNTER — Other Ambulatory Visit: Payer: Self-pay | Admitting: Physician Assistant

## 2023-07-04 ENCOUNTER — Ambulatory Visit: Payer: BC Managed Care – PPO | Admitting: Physician Assistant

## 2023-07-07 ENCOUNTER — Inpatient Hospital Stay: Payer: BC Managed Care – PPO

## 2023-07-07 LAB — CBC WITH DIFFERENTIAL (CANCER CENTER ONLY)
Abs Immature Granulocytes: 0.03 10*3/uL (ref 0.00–0.07)
Basophils Absolute: 0 10*3/uL (ref 0.0–0.1)
Basophils Relative: 0 %
Eosinophils Absolute: 0.1 10*3/uL (ref 0.0–0.5)
Eosinophils Relative: 1 %
HCT: 41.6 % (ref 39.0–52.0)
Hemoglobin: 14.1 g/dL (ref 13.0–17.0)
Immature Granulocytes: 0 %
Lymphocytes Relative: 31 %
Lymphs Abs: 2.4 10*3/uL (ref 0.7–4.0)
MCH: 32 pg (ref 26.0–34.0)
MCHC: 33.9 g/dL (ref 30.0–36.0)
MCV: 94.3 fL (ref 80.0–100.0)
Monocytes Absolute: 0.4 10*3/uL (ref 0.1–1.0)
Monocytes Relative: 5 %
Neutro Abs: 4.8 10*3/uL (ref 1.7–7.7)
Neutrophils Relative %: 63 %
Platelet Count: 116 10*3/uL — ABNORMAL LOW (ref 150–400)
RBC: 4.41 MIL/uL (ref 4.22–5.81)
RDW: 13.8 % (ref 11.5–15.5)
WBC Count: 7.6 10*3/uL (ref 4.0–10.5)
nRBC: 0 % (ref 0.0–0.2)

## 2023-07-07 LAB — IRON AND IRON BINDING CAPACITY (CC-WL,HP ONLY)
Iron: 192 ug/dL — ABNORMAL HIGH (ref 45–182)
Saturation Ratios: 66 % — ABNORMAL HIGH (ref 17.9–39.5)
TIBC: 290 ug/dL (ref 250–450)
UIBC: 98 ug/dL — ABNORMAL LOW (ref 117–376)

## 2023-07-07 NOTE — Progress Notes (Signed)
 Lance Matthews presents today for phlebotomy per MD orders. Phlebotomy procedure started at 1537 and ended at 1545. 510 grams removed. 16G phlebotomy kit used to R AC. Patient did not want fluids today and declined to stay for observation.  Patient tolerated procedure well. IV needle removed intact. VSS. Ambulatory to discharge.

## 2023-07-08 ENCOUNTER — Other Ambulatory Visit: Payer: Self-pay | Admitting: Physician Assistant

## 2023-07-08 LAB — FERRITIN: Ferritin: 70 ng/mL (ref 24–336)

## 2023-07-19 ENCOUNTER — Other Ambulatory Visit: Payer: Self-pay | Admitting: Physician Assistant

## 2023-07-19 DIAGNOSIS — F4312 Post-traumatic stress disorder, chronic: Secondary | ICD-10-CM

## 2023-07-21 ENCOUNTER — Inpatient Hospital Stay: Payer: BC Managed Care – PPO | Attending: Nurse Practitioner

## 2023-07-21 ENCOUNTER — Inpatient Hospital Stay: Payer: BC Managed Care – PPO

## 2023-07-21 DIAGNOSIS — F4310 Post-traumatic stress disorder, unspecified: Secondary | ICD-10-CM | POA: Insufficient documentation

## 2023-07-21 DIAGNOSIS — K7460 Unspecified cirrhosis of liver: Secondary | ICD-10-CM | POA: Diagnosis not present

## 2023-07-21 DIAGNOSIS — Z9049 Acquired absence of other specified parts of digestive tract: Secondary | ICD-10-CM | POA: Insufficient documentation

## 2023-07-21 DIAGNOSIS — Z79899 Other long term (current) drug therapy: Secondary | ICD-10-CM | POA: Diagnosis not present

## 2023-07-21 LAB — CBC WITH DIFFERENTIAL (CANCER CENTER ONLY)
Abs Immature Granulocytes: 0.02 10*3/uL (ref 0.00–0.07)
Basophils Absolute: 0 10*3/uL (ref 0.0–0.1)
Basophils Relative: 1 %
Eosinophils Absolute: 0.1 10*3/uL (ref 0.0–0.5)
Eosinophils Relative: 1 %
HCT: 41.2 % (ref 39.0–52.0)
Hemoglobin: 14.2 g/dL (ref 13.0–17.0)
Immature Granulocytes: 0 %
Lymphocytes Relative: 34 %
Lymphs Abs: 2.8 10*3/uL (ref 0.7–4.0)
MCH: 32.2 pg (ref 26.0–34.0)
MCHC: 34.5 g/dL (ref 30.0–36.0)
MCV: 93.4 fL (ref 80.0–100.0)
Monocytes Absolute: 0.3 10*3/uL (ref 0.1–1.0)
Monocytes Relative: 4 %
Neutro Abs: 4.9 10*3/uL (ref 1.7–7.7)
Neutrophils Relative %: 60 %
Platelet Count: 136 10*3/uL — ABNORMAL LOW (ref 150–400)
RBC: 4.41 MIL/uL (ref 4.22–5.81)
RDW: 14.4 % (ref 11.5–15.5)
WBC Count: 8.2 10*3/uL (ref 4.0–10.5)
nRBC: 0 % (ref 0.0–0.2)

## 2023-07-21 LAB — IRON AND IRON BINDING CAPACITY (CC-WL,HP ONLY)
Iron: 122 ug/dL (ref 45–182)
Saturation Ratios: 38 % (ref 17.9–39.5)
TIBC: 323 ug/dL (ref 250–450)
UIBC: 201 ug/dL (ref 117–376)

## 2023-07-21 LAB — FERRITIN: Ferritin: 42 ng/mL (ref 24–336)

## 2023-07-21 NOTE — Progress Notes (Signed)
 Lance Matthews presents today for phlebotomy per MD orders. Phlebotomy procedure started at 1550 and ended at 1557. 510 grams removed. Patient declined post phlebotomy observation. Patient tolerated procedure well. IV needle removed intact. VSS at discharge. Ambulated to lobby.

## 2023-07-21 NOTE — Patient Instructions (Signed)

## 2023-07-22 ENCOUNTER — Encounter: Payer: Self-pay | Admitting: Nurse Practitioner

## 2023-08-11 ENCOUNTER — Other Ambulatory Visit: Payer: Self-pay

## 2023-08-11 ENCOUNTER — Inpatient Hospital Stay: Payer: BC Managed Care – PPO

## 2023-08-11 ENCOUNTER — Inpatient Hospital Stay: Payer: BC Managed Care – PPO | Admitting: Hematology

## 2023-08-11 VITALS — BP 126/74 | HR 62 | Temp 97.9°F | Resp 18 | Ht 74.0 in | Wt 185.8 lb

## 2023-08-11 DIAGNOSIS — K7469 Other cirrhosis of liver: Secondary | ICD-10-CM

## 2023-08-11 LAB — CBC WITH DIFFERENTIAL (CANCER CENTER ONLY)
Abs Immature Granulocytes: 0.04 10*3/uL (ref 0.00–0.07)
Basophils Absolute: 0 10*3/uL (ref 0.0–0.1)
Basophils Relative: 1 %
Eosinophils Absolute: 0.1 10*3/uL (ref 0.0–0.5)
Eosinophils Relative: 2 %
HCT: 37.9 % — ABNORMAL LOW (ref 39.0–52.0)
Hemoglobin: 13 g/dL (ref 13.0–17.0)
Immature Granulocytes: 1 %
Lymphocytes Relative: 28 %
Lymphs Abs: 2.2 10*3/uL (ref 0.7–4.0)
MCH: 32.1 pg (ref 26.0–34.0)
MCHC: 34.3 g/dL (ref 30.0–36.0)
MCV: 93.6 fL (ref 80.0–100.0)
Monocytes Absolute: 0.3 10*3/uL (ref 0.1–1.0)
Monocytes Relative: 4 %
Neutro Abs: 5.1 10*3/uL (ref 1.7–7.7)
Neutrophils Relative %: 64 %
Platelet Count: 112 10*3/uL — ABNORMAL LOW (ref 150–400)
RBC: 4.05 MIL/uL — ABNORMAL LOW (ref 4.22–5.81)
RDW: 14.1 % (ref 11.5–15.5)
WBC Count: 7.8 10*3/uL (ref 4.0–10.5)
nRBC: 0 % (ref 0.0–0.2)
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
## 2023-08-12 ENCOUNTER — Encounter: Payer: Self-pay | Admitting: Nurse Practitioner

## 2023-08-12 LAB — FERRITIN: Ferritin: 35 ng/mL (ref 24–336)

## 2023-08-16 ENCOUNTER — Encounter (HOSPITAL_COMMUNITY): Payer: Self-pay

## 2023-08-16 ENCOUNTER — Ambulatory Visit (HOSPITAL_COMMUNITY)

## 2023-08-27 ENCOUNTER — Other Ambulatory Visit: Payer: Self-pay | Admitting: Physician Assistant

## 2023-08-27 DIAGNOSIS — F4312 Post-traumatic stress disorder, chronic: Secondary | ICD-10-CM

## 2023-08-29 ENCOUNTER — Encounter: Payer: Self-pay | Admitting: Physician Assistant

## 2023-08-29 ENCOUNTER — Ambulatory Visit (INDEPENDENT_AMBULATORY_CARE_PROVIDER_SITE_OTHER): Admitting: Physician Assistant

## 2023-08-29 DIAGNOSIS — F411 Generalized anxiety disorder: Secondary | ICD-10-CM

## 2023-08-29 DIAGNOSIS — F3341 Major depressive disorder, recurrent, in partial remission: Secondary | ICD-10-CM | POA: Diagnosis not present

## 2023-08-29 DIAGNOSIS — F5105 Insomnia due to other mental disorder: Secondary | ICD-10-CM | POA: Diagnosis not present

## 2023-08-29 DIAGNOSIS — F4312 Post-traumatic stress disorder, chronic: Secondary | ICD-10-CM | POA: Diagnosis not present

## 2023-08-29 DIAGNOSIS — F99 Mental disorder, not otherwise specified: Secondary | ICD-10-CM

## 2023-08-29 MED ORDER — ZOLPIDEM TARTRATE 10 MG PO TABS: 15.0000 mg | ORAL_TABLET | Freq: Every evening | ORAL | 5 refills | Status: DC | PRN

## 2023-08-29 MED ORDER — LITHIUM CARBONATE 150 MG PO CAPS: 150.0000 mg | ORAL_CAPSULE | Freq: Every day | ORAL | 5 refills | Status: DC

## 2023-08-29 MED ORDER — ALPRAZOLAM 1 MG PO TABS: 1.0000 mg | ORAL_TABLET | Freq: Two times a day (BID) | ORAL | 5 refills | Status: DC | PRN

## 2023-08-29 MED ORDER — FLUOXETINE HCL 20 MG PO CAPS: 80.0000 mg | ORAL_CAPSULE | Freq: Every day | ORAL | Status: DC

## 2023-08-29 MED ORDER — GABAPENTIN 600 MG PO TABS: 600.0000 mg | ORAL_TABLET | Freq: Every day | ORAL | 5 refills | Status: DC

## 2023-08-29 MED ORDER — RISPERIDONE 1 MG PO TABS: 1.0000 mg | ORAL_TABLET | Freq: Every day | ORAL | 5 refills | Status: AC

## 2023-08-29 NOTE — Progress Notes (Signed)
 [facility]  Patient ID: Lance Matthews,  MRN: [id number]  PCP: Ronna Coho, MD  Date of Evaluation: 08/29/2023 Time spent:20 minutes  Chief Complaint:  Chief Complaint   Anxiety; Depression; Insomnia; Follow-up    HISTORY/CURRENT STATUS: HPI For 6 week med check.  Still feels sad a lot but not nearly as bad as used to be. Feels like his meds are working but maybe not strong enough.  Energy and motivation are good.  Work is going well.   No extreme sadness, tearfulness, or feelings of hopelessness.  Sleeps well most of the time. ADLs and personal hygiene are normal.   Denies any changes in concentration, making decisions, or remembering things.  Appetite has not changed.  Weight is stable.  Not having PA but feels overwhelmed a lot of the time. Xanax  is helpful. Denies suicidal or homicidal thoughts.  Patient denies increased energy with decreased need for sleep, increased talkativeness, racing thoughts, impulsivity or risky behaviors, increased spending, increased libido, grandiosity, increased irritability or anger, paranoia, or hallucinations.  Denies dizziness, syncope, seizures, numbness, tingling, tremor, tics, unsteady gait, slurred speech, confusion. Denies muscle or joint pain, stiffness, or dystonia. Denies unexplained weight loss, frequent infections, or sores that heal slowly.  No polyphagia, polydipsia, or polyuria. Denies visual changes or paresthesias.   Individual Medical History/ Review of Systems: Changes? :No   Past medications for mental health diagnoses include: Chantix, Ambien , Prozac , Zquil, Paxil , Abilify, Gabapentin , Risperdal , Ambien   Has been pt at [facility] Has been inpt for a month or so at [facility]. He was around 53 yo.   Cut his arms a few times as a kid 'to get attn'  Allergies: Patient has no known allergies.  Current Medications:  Current Outpatient Medications:    ibuprofen (ADVIL,MOTRIN) 200 MG tablet, Take  200-600 mg by mouth every 6 (six) hours as needed for headache or moderate pain., Disp: , Rfl:    omeprazole (PRILOSEC) 10 MG capsule, Take 10 mg by mouth daily., Disp: , Rfl:    sildenafil  (VIAGRA ) 50 MG tablet, Take 1 tablet (50 mg total) by mouth daily as needed for erectile dysfunction., Disp: 10 tablet, Rfl: 5   ALPRAZolam  (XANAX ) 1 MG tablet, Take 1 tablet (1 mg total) by mouth 2 (two) times daily as needed for anxiety., Disp: 60 tablet, Rfl: 5   FLUoxetine  (PROZAC ) 20 MG capsule, Take 4 capsules (80 mg total) by mouth daily., Disp: , Rfl:    gabapentin  (NEURONTIN ) 600 MG tablet, Take 1 tablet (600 mg total) by mouth at bedtime., Disp: 30 tablet, Rfl: 5   lithium  carbonate 150 MG capsule, Take 1 capsule (150 mg total) by mouth at bedtime., Disp: 30 capsule, Rfl: 5   risperiDONE  (RISPERDAL ) 1 MG tablet, Take 1 tablet (1 mg total) by mouth at bedtime., Disp: 30 tablet, Rfl: 5   zolpidem  (AMBIEN ) 10 MG tablet, Take 1.5 tablets (15 mg total) by mouth at bedtime as needed for sleep., Disp: 45 tablet, Rfl: 5 Medication Side Effects: none  Family Medical/ Social History: Changes? No  MENTAL HEALTH EXAM:  There were no vitals taken for this visit.There is no height or weight on file to calculate BMI.  General Appearance: Casual and Well Groomed  Eye Contact:  Good  Speech:  Clear and Coherent and Normal Rate  Volume:  Normal  Mood:  Anxious  Affect:  Anxious  Thought Process:  Goal Directed and Descriptions of Associations: Circumstantial  Orientation:  Full (Time, Place, and Person)  Thought Content: Logical   Suicidal Thoughts:  No  Homicidal Thoughts:  No  Memory:  WNL  Judgement:  Good  Insight:  Good  Psychomotor Activity:  Normal  Concentration:  Concentration: Good  Recall:  Good  Fund of Knowledge: Good  Language: Good  Assets:  Communication Skills Desire for Improvement Financial Resources/Insurance Housing Transportation  ADL's:  Intact  Cognition: WNL  Prognosis:   Good   DIAGNOSES:    ICD-10-CM   1. Recurrent major depression in partial remission (HCC)  F33.41     2. Post-traumatic stress disorder, chronic  F43.12 zolpidem  (AMBIEN ) 10 MG tablet    risperiDONE  (RISPERDAL ) 1 MG tablet    3. Generalized anxiety disorder  F41.1     4. Insomnia due to other mental disorder  F51.05    F99      Receiving Psychotherapy: Yes  with Manson Seitz, [facility], weekly  RECOMMENDATIONS:  PDMP reviewed.  Gabapentin  filled 08/01/2023.  Xanax  filled 08/28/2023.  Ambien  filled 08/28/2023. I provided 20 minutes of face to face time during this encounter, including time spent before and after the visit in records review, medical decision making, counseling pertinent to today's visit, and charting.   Recommend increasing Prozac  to max dose. He would like to try it. Pros nd cons disccussed and he accpets.   Continue Xanax  1 mg, 1 p.o. twice daily as needed. Increase Prozac  80 mg daily. Continue gabapentin  600 mg nightly. Continue lithium  150 mg, 1 at bedtime. Continue Risperdal  1 mg, 1 nightly. Continue Ambien  10 mg, 1.5 pills nightly as needed.  This is higher than recommended dose however he has been on this for many years so I am not decreasing it. Continue therapy with Manson Seitz, [facility]. Return in 2 months.   Marvia Slocumb, PA-C

## 2023-08-31 ENCOUNTER — Other Ambulatory Visit: Payer: Self-pay | Admitting: Physician Assistant

## 2023-09-03 ENCOUNTER — Other Ambulatory Visit: Payer: Self-pay | Admitting: Physician Assistant

## 2023-09-10 ENCOUNTER — Telehealth: Payer: Self-pay | Admitting: Physician Assistant

## 2023-09-10 NOTE — Telephone Encounter (Signed)
 noted

## 2023-09-10 NOTE — Telephone Encounter (Signed)
 Pt seen 5/16 and Prozac  was increased to 80 mg. He is reporting increased anxiety since then. He said there had been no other changes in his life. He is willing to continue at 80 mg for now, but wanted an "expert opinion" on this. No other complaints.

## 2023-09-10 NOTE — Telephone Encounter (Signed)
 Recommend he stay at the current dose for at least 5 more days. Make sure he's not drinking caffeine or energy drinks at all. Take Xanax  as directed.  Go to ER if has CP, SOB.  Call back in a week if he's no better.

## 2023-09-10 NOTE — Telephone Encounter (Signed)
 Reviewed recommendations with patient.  He is agreeable to continuing the Prozac . He said he can't cut out all caffeine, but will work on cutting back. He said he refuses to go to the ER. I told him he asked for an expert opinion and that is what was provided. Hopefully there will be no need to even consider the ER. Asked that he FU next week if no better.

## 2023-09-10 NOTE — Telephone Encounter (Signed)
 Pt LVM @ 8:26a stating that since he increased dose of Prozac , it feels like it's having the opposite effect.  His anxiety "is through the roof".  He said he has been taking the increased dose for about 1.5-2 wks.  Next appt 7/16

## 2023-09-13 ENCOUNTER — Other Ambulatory Visit: Payer: Self-pay | Admitting: Physician Assistant

## 2023-10-04 ENCOUNTER — Other Ambulatory Visit: Payer: Self-pay | Admitting: Physician Assistant

## 2023-10-05 MED ORDER — FLUOXETINE HCL 40 MG PO CAPS: 80.0000 mg | ORAL_CAPSULE | Freq: Every day | ORAL | 0 refills | Status: DC

## 2023-10-23 ENCOUNTER — Other Ambulatory Visit: Payer: Self-pay | Admitting: Physician Assistant

## 2023-10-29 ENCOUNTER — Ambulatory Visit: Admitting: Physician Assistant

## 2023-10-29 ENCOUNTER — Encounter: Payer: Self-pay | Admitting: Physician Assistant

## 2023-10-29 DIAGNOSIS — F411 Generalized anxiety disorder: Secondary | ICD-10-CM

## 2023-10-29 DIAGNOSIS — F331 Major depressive disorder, recurrent, moderate: Secondary | ICD-10-CM | POA: Diagnosis not present

## 2023-10-29 DIAGNOSIS — F422 Mixed obsessional thoughts and acts: Secondary | ICD-10-CM | POA: Diagnosis not present

## 2023-10-29 MED ORDER — MEMANTINE HCL 5 MG PO TABS: ORAL_TABLET | ORAL | 1 refills | Status: DC

## 2023-10-29 MED ORDER — FLUOXETINE HCL 40 MG PO CAPS: 40.0000 mg | ORAL_CAPSULE | Freq: Every day | ORAL | Status: DC

## 2023-10-29 NOTE — Progress Notes (Unsigned)
 [facility]  Patient ID: Lance Matthews,  MRN: [id number]  PCP: Kip Righter, MD  Date of Evaluation: 10/29/2023 Time spent:25 minutes  Chief Complaint:  Chief Complaint   Anxiety; Depression; Insomnia; Follow-up    HISTORY/CURRENT STATUS: HPI  for 2 month med check.  We increased the Prozac  2 months ago. The anxiety has gotten much worse, has PA often.  When they occur, they can last 30 minutes to several hours. Gets SOB and feels heaviness on his chest. They happen at least 6 days per week. Not sure if it's related to the Prozac  or not, but that's the only change.  Tremor in hands, 'for awhile.' Worse with movement.  Not affecting ADLs or work.  Reports ruminating thoughts.  When something gets stuck in his head he has an extremely hard time letting go.  This is not new, just worse.  Continues to have a hard time enjoying things.  Energy and motivation are fair to good depending on the day.  Work is ok, not missing.  Appetite is normal and weight is stable.  Not crying easily.  He feels hopeless at times.  No mania, delirium, or psychosis.  No plan for suicide but does get tired of living like this.  No homicidal thoughts.  Patient denies increased energy with decreased need for sleep, increased talkativeness, racing thoughts, impulsivity or risky behaviors, increased spending, increased libido, grandiosity, increased irritability or anger, paranoia, or hallucinations.  Denies dizziness, syncope, seizures, numbness, tingling, tremor, tics, unsteady gait, slurred speech, confusion. Denies muscle or joint pain, stiffness, or dystonia.  Individual Medical History/ Review of Systems: Changes? :No   Past medications for mental health diagnoses include: Chantix, Ambien , Prozac , Zquil, Paxil , Abilify caused unknown SE, Gabapentin , Risperdal   Has been pt at [facility] Has been inpt for a month or so at [facility]. He was around 53 yo.   Cut his arms a few  times as a kid 'to get attn'  Allergies: Patient has no known allergies.  Current Medications:  Current Outpatient Medications:    ALPRAZolam  (XANAX ) 1 MG tablet, Take 1 tablet (1 mg total) by mouth 2 (two) times daily as needed for anxiety., Disp: 60 tablet, Rfl: 5   gabapentin  (NEURONTIN ) 600 MG tablet, Take 1 tablet (600 mg total) by mouth at bedtime., Disp: 30 tablet, Rfl: 5   ibuprofen (ADVIL,MOTRIN) 200 MG tablet, Take 200-600 mg by mouth every 6 (six) hours as needed for headache or moderate pain., Disp: , Rfl:    lithium  carbonate 150 MG capsule, TAKE 1 CAPSULE BY MOUTH AT BEDTIME, Disp: 30 capsule, Rfl: 2   memantine  (NAMENDA ) 5 MG tablet, 1 po at bedtime for 1 week. Then 1 po bid., Disp: 60 tablet, Rfl: 1   omeprazole (PRILOSEC) 10 MG capsule, Take 10 mg by mouth daily., Disp: , Rfl:    risperiDONE  (RISPERDAL ) 1 MG tablet, Take 1 tablet (1 mg total) by mouth at bedtime., Disp: 30 tablet, Rfl: 5   sildenafil  (VIAGRA ) 50 MG tablet, Take 1 tablet (50 mg total) by mouth daily as needed for erectile dysfunction., Disp: 10 tablet, Rfl: 5   zolpidem  (AMBIEN ) 10 MG tablet, Take 1.5 tablets (15 mg total) by mouth at bedtime as needed for sleep., Disp: 45 tablet, Rfl: 5   FLUoxetine  (PROZAC ) 40 MG capsule, Take 1 capsule (40 mg total) by mouth daily., Disp: , Rfl:  Medication Side Effects: none  Family Medical/ Social History: Changes? No  MENTAL HEALTH EXAM:  There  were no vitals taken for this visit.There is no height or weight on file to calculate BMI.  General Appearance: Casual and Well Groomed  Eye Contact:  Good  Speech:  Clear and Coherent and Normal Rate  Volume:  Normal  Mood:  Anxious and Depressed  Affect:  Anxious  Thought Process:  Goal Directed and Descriptions of Associations: Circumstantial  Orientation:  Full (Time, Place, and Person)  Thought Content: Logical   Suicidal Thoughts:  No  Homicidal Thoughts:  No  Memory:  WNL  Judgement:  Good  Insight:  Good   Psychomotor Activity:  Normal  Concentration:  Concentration: Good  Recall:  Good  Fund of Knowledge: Good  Language: Good  Assets:  Communication Skills Desire for Improvement Financial Resources/Insurance Housing Transportation  ADL's:  Intact  Cognition: WNL  Prognosis:  Good   DIAGNOSES:    ICD-10-CM   1. Mixed obsessional thoughts and acts  F42.2     2. Generalized anxiety disorder  F41.1     3. Major depressive disorder, recurrent episode, moderate (HCC)  F33.1      Receiving Psychotherapy: Yes  with [name], [facility], weekly  RECOMMENDATIONS:  PDMP reviewed.  Next filled 10/26/2023.  Ambien  filled 10/26/2023.  Gabapentin  filled 10/25/2023. I provided approximately 25 minutes of face to face time during this encounter, including time spent before and after the visit in records review, medical decision making, counseling pertinent to today's visit, and charting.   After discussing the patterns of anxiety that he has had over the years, I think he has OCD.  It is unclear as to whether the Prozac  was the culprit of increasing the anxiety but it is highly likely since that was the only change made.  I recommend decreasing that.  As far as specific treatment for OCD, SSRIs, clomipramine, Abilify, or Namenda  are recommended.  The Abilify and Namenda  are off-label however they can be very effective.  He has taken Abilify in the past and had side effects but not sure what they were.  Therefore we will avoid that.  Changing Prozac  to Luvox or Zoloft is an option, however I would recommend Namenda  over changing SSRIs at this point.  Benefits, risk and side effects were discussed and he would like to try it.    Continue Xanax  1 mg, 1 p.o. twice daily as needed. Decrease Prozac  to 40 mg daily. Continue gabapentin  600 mg nightly. Continue lithium  150 mg, 1 at bedtime. Start Namenda  5 mg, 1 p.o. nightly for 1 week and then may increase to 1 p.o. twice daily if well-tolerated. Continue  Risperdal  1 mg, 1 nightly. Continue Ambien  10 mg, 1.5 pills nightly as needed.  This is higher than recommended dose however he has been on this for many years so I am not decreasing it. Continue therapy with [name], [facility]. Return in 6 to 8 weeks.  Verneita Cooks, PA-C

## 2023-10-31 ENCOUNTER — Encounter: Payer: Self-pay | Admitting: Advanced Practice Midwife

## 2023-11-10 ENCOUNTER — Inpatient Hospital Stay: Attending: Nurse Practitioner

## 2023-11-10 DIAGNOSIS — K7469 Other cirrhosis of liver: Secondary | ICD-10-CM

## 2023-11-10 LAB — CBC WITH DIFFERENTIAL (CANCER CENTER ONLY)
Abs Immature Granulocytes: 0.03 K/uL (ref 0.00–0.07)
Basophils Absolute: 0 K/uL (ref 0.0–0.1)
Basophils Relative: 1 %
Eosinophils Absolute: 0.2 K/uL (ref 0.0–0.5)
Eosinophils Relative: 3 %
HCT: 39.3 % (ref 39.0–52.0)
Hemoglobin: 13.6 g/dL (ref 13.0–17.0)
Immature Granulocytes: 0 %
Lymphocytes Relative: 32 %
Lymphs Abs: 2.5 K/uL (ref 0.7–4.0)
MCH: 31.6 pg (ref 26.0–34.0)
MCHC: 34.6 g/dL (ref 30.0–36.0)
MCV: 91.4 fL (ref 80.0–100.0)
Monocytes Absolute: 0.4 K/uL (ref 0.1–1.0)
Monocytes Relative: 5 %
Neutro Abs: 4.7 K/uL (ref 1.7–7.7)
Neutrophils Relative %: 59 %
Platelet Count: 132 K/uL — ABNORMAL LOW (ref 150–400)
RBC: 4.3 MIL/uL (ref 4.22–5.81)
RDW: 14.2 % (ref 11.5–15.5)
WBC Count: 7.9 K/uL (ref 4.0–10.5)
nRBC: 0 % (ref 0.0–0.2)

## 2023-11-10 LAB — FERRITIN: Ferritin: 66 ng/mL (ref 24–336)

## 2023-11-11 LAB — AFP TUMOR MARKER: AFP, Serum, Tumor Marker: 2.3 ng/mL (ref 0.0–6.9)

## 2023-11-12 ENCOUNTER — Ambulatory Visit: Payer: Self-pay | Admitting: Hematology

## 2023-11-13 ENCOUNTER — Encounter: Payer: Self-pay | Admitting: Nurse Practitioner

## 2023-11-13 NOTE — Telephone Encounter (Addendum)
 Contacted patient via telephone call.  Went over provider's comments from below. Patient voiced understanding. Patient did not want to be transferred to scheduling. Patient stated he will contact the facility to get the phlebotomy scheduled.   ----- Message from Onita Mattock sent at 11/12/2023  7:21 AM EDT ----- Please let pt know his ferritin is above 50 and I recommend phlebotomy, please schedule, thx   Onita Mattock  ----- Message ----- From: Rebecka, [facility] [place] Sent: 11/10/2023   2:09 PM EDT To: Onita Mattock, MD

## 2023-11-21 ENCOUNTER — Other Ambulatory Visit: Payer: Self-pay | Admitting: Physician Assistant

## 2023-12-10 ENCOUNTER — Ambulatory Visit: Admitting: Physician Assistant

## 2023-12-10 ENCOUNTER — Encounter: Payer: Self-pay | Admitting: Physician Assistant

## 2023-12-10 DIAGNOSIS — F411 Generalized anxiety disorder: Secondary | ICD-10-CM

## 2023-12-10 DIAGNOSIS — F331 Major depressive disorder, recurrent, moderate: Secondary | ICD-10-CM | POA: Diagnosis not present

## 2023-12-10 DIAGNOSIS — F422 Mixed obsessional thoughts and acts: Secondary | ICD-10-CM | POA: Diagnosis not present

## 2023-12-10 DIAGNOSIS — F99 Mental disorder, not otherwise specified: Secondary | ICD-10-CM

## 2023-12-10 DIAGNOSIS — F4312 Post-traumatic stress disorder, chronic: Secondary | ICD-10-CM

## 2023-12-10 DIAGNOSIS — F5105 Insomnia due to other mental disorder: Secondary | ICD-10-CM

## 2023-12-10 MED ORDER — MEMANTINE HCL 10 MG PO TABS: 10.0000 mg | ORAL_TABLET | Freq: Two times a day (BID) | ORAL | 1 refills | Status: DC

## 2023-12-10 NOTE — Progress Notes (Unsigned)
 [facility]  Patient ID: Lance Matthews,  MRN: [id number]  PCP: Kip Righter, MD  Date of Evaluation: 12/10/2023 Time spent:25 minutes  Chief Complaint:  Chief Complaint   Anxiety; Depression; Follow-up     HISTORY/CURRENT STATUS: HPI  for 2 month med check.      Individual Medical History/ Review of Systems: Changes? :No   Past medications for mental health diagnoses include: Chantix, Ambien , Prozac , Zquil, Paxil , Abilify caused unknown SE, Gabapentin , Risperdal   Has been pt at [facility] Has been inpt for a month or so at [facility]. He was around 53 yo.   Cut his arms a few times as a kid 'to get attn'  Allergies: Patient has no known allergies.  Current Medications:  Current Outpatient Medications:    ALPRAZolam  (XANAX ) 1 MG tablet, Take 1 tablet (1 mg total) by mouth 2 (two) times daily as needed for anxiety., Disp: 60 tablet, Rfl: 5   FLUoxetine  (PROZAC ) 40 MG capsule, Take 1 capsule (40 mg total) by mouth daily., Disp: 30 capsule, Rfl: 0   gabapentin  (NEURONTIN ) 600 MG tablet, Take 1 tablet (600 mg total) by mouth at bedtime., Disp: 30 tablet, Rfl: 5   ibuprofen (ADVIL,MOTRIN) 200 MG tablet, Take 200-600 mg by mouth every 6 (six) hours as needed for headache or moderate pain., Disp: , Rfl:    lithium  carbonate 150 MG capsule, TAKE 1 CAPSULE BY MOUTH AT BEDTIME, Disp: 30 capsule, Rfl: 2   memantine  (NAMENDA ) 10 MG tablet, Take 1 tablet (10 mg total) by mouth 2 (two) times daily., Disp: 60 tablet, Rfl: 1   risperiDONE  (RISPERDAL ) 1 MG tablet, Take 1 tablet (1 mg total) by mouth at bedtime., Disp: 30 tablet, Rfl: 5   sildenafil  (VIAGRA ) 50 MG tablet, Take 1 tablet (50 mg total) by mouth daily as needed for erectile dysfunction., Disp: 10 tablet, Rfl: 5   zolpidem  (AMBIEN ) 10 MG tablet, Take 1.5 tablets (15 mg total) by mouth at bedtime as needed for sleep., Disp: 45 tablet, Rfl: 5 Medication Side Effects: none  Family Medical/ Social  History: Changes? No  MENTAL HEALTH EXAM:  There were no vitals taken for this visit.There is no height or weight on file to calculate BMI.  General Appearance: Casual and Well Groomed  Eye Contact:  Good  Speech:  Clear and Coherent and Normal Rate  Volume:  Normal  Mood:  Anxious and Depressed  Affect:  Anxious  Thought Process:  Goal Directed and Descriptions of Associations: Circumstantial  Orientation:  Full (Time, Place, and Person)  Thought Content: Logical   Suicidal Thoughts:  No  Homicidal Thoughts:  No  Memory:  WNL  Judgement:  Good  Insight:  Good  Psychomotor Activity:  Normal  Concentration:  Concentration: Good  Recall:  Good  Fund of Knowledge: Good  Language: Good  Assets:  Communication Skills Desire for Improvement Financial Resources/Insurance Housing Transportation  ADL's:  Intact  Cognition: WNL  Prognosis:  Good   DIAGNOSES:  No diagnosis found.  Receiving Psychotherapy: Yes  with Lance Matthews, [facility], weekly  RECOMMENDATIONS:  PDMP reviewed.  Xanax  filled 11/24/2023.  Ambien  filled 11/24/2023.  Gabapentin  filled 11/21/2023.           Continue Xanax  1 mg, 1 p.o. twice daily as needed. Continue Prozac   40 mg daily. Continue gabapentin  600 mg nightly. Continue lithium  150 mg, 1 at bedtime. Continue Namenda  5 mg,  1 p.o. twice daily. Continue Risperdal  1 mg, 1 nightly. Continue Ambien  10  mg, 1.5 pills nightly as needed.  This is higher than recommended dose however he has been on this for many years so I am not decreasing it. Continue therapy with Lance Matthews, [facility]. Return in 6 to 8 weeks.  Verneita Cooks, PA-C

## 2023-12-18 ENCOUNTER — Other Ambulatory Visit: Payer: Self-pay | Admitting: Physician Assistant

## 2024-01-22 ENCOUNTER — Ambulatory Visit: Admitting: Physician Assistant

## 2024-01-22 ENCOUNTER — Encounter: Payer: Self-pay | Admitting: Physician Assistant

## 2024-01-22 DIAGNOSIS — F99 Mental disorder, not otherwise specified: Secondary | ICD-10-CM

## 2024-01-22 DIAGNOSIS — F5105 Insomnia due to other mental disorder: Secondary | ICD-10-CM | POA: Diagnosis not present

## 2024-01-22 DIAGNOSIS — F331 Major depressive disorder, recurrent, moderate: Secondary | ICD-10-CM

## 2024-01-22 DIAGNOSIS — F900 Attention-deficit hyperactivity disorder, predominantly inattentive type: Secondary | ICD-10-CM | POA: Diagnosis not present

## 2024-01-22 DIAGNOSIS — F411 Generalized anxiety disorder: Secondary | ICD-10-CM | POA: Diagnosis not present

## 2024-01-22 MED ORDER — MODAFINIL 200 MG PO TABS: ORAL_TABLET | ORAL | 1 refills | Status: DC

## 2024-01-22 NOTE — Progress Notes (Signed)
 [facility]  Patient ID: Lance Matthews,  MRN: [id number]  PCP: Kip Righter, MD  Date of Evaluation: 01/22/2024 Time spent:20 minutes  Chief Complaint:  Chief Complaint   Anxiety; Depression; Follow-up    HISTORY/CURRENT STATUS: HPI  for 6 week med check.  He stopped the Namenda . It made the anxiety worse and increased SI. Caused his teeth to hurt. States he couldn't tolerate it.   Still has a lot of anxiety.  Obsesses about things. I've learned to live with it. No PA, just overwhelmed and it's hard to get things out of his mind once a thought gets stuck in his head.  The Xanax  helps though.   Asks about something to treat ADD. He was dx as a kid and took Ritalin.  Unsure of results. Has a hard time focusing and staying on task.   Energy and motivation are good.  Work is going well.   No extreme sadness, tearfulness, or feelings of hopelessness.  Sleeps ok.  ADLs and personal hygiene are normal.  Appetite has not changed.  Weight is stable.   No mania, delirium, AH/VH.  No SI/HI.  Individual Medical History/ Review of Systems: Changes? :No   Past medications for mental health diagnoses include: Chantix, Ambien , Prozac , Zquil, Paxil , Abilify caused unknown SE, Gabapentin , Risperdal  Namenda  caused anxiety, SI and teeth pain Ritalin  Has been pt at [facility] Has been inpt for a month or so at [facility]. He was around 53 yo.   Cut his arms a few times as a kid 'to get attn'  Allergies: Patient has no known allergies.  Current Medications:  Current Outpatient Medications:    modafinil (PROVIGIL) 200 MG tablet, 1/2 po q am for 4 days, then may increase to 1 po q am., Disp: 30 tablet, Rfl: 1   ALPRAZolam  (XANAX ) 1 MG tablet, Take 1 tablet (1 mg total) by mouth 2 (two) times daily as needed for anxiety., Disp: 60 tablet, Rfl: 5   FLUoxetine  (PROZAC ) 40 MG capsule, TAKE 1 CAPSULE BY MOUTH DAILY, Disp: 60 capsule, Rfl: 0   gabapentin  (NEURONTIN )  600 MG tablet, Take 1 tablet (600 mg total) by mouth at bedtime., Disp: 30 tablet, Rfl: 5   ibuprofen (ADVIL,MOTRIN) 200 MG tablet, Take 200-600 mg by mouth every 6 (six) hours as needed for headache or moderate pain., Disp: , Rfl:    lithium  carbonate 150 MG capsule, TAKE 1 CAPSULE BY MOUTH AT BEDTIME, Disp: 30 capsule, Rfl: 2   risperiDONE  (RISPERDAL ) 1 MG tablet, Take 1 tablet (1 mg total) by mouth at bedtime., Disp: 30 tablet, Rfl: 5   sildenafil  (VIAGRA ) 50 MG tablet, Take 1 tablet (50 mg total) by mouth daily as needed for erectile dysfunction., Disp: 10 tablet, Rfl: 5   zolpidem  (AMBIEN ) 10 MG tablet, Take 1.5 tablets (15 mg total) by mouth at bedtime as needed for sleep., Disp: 45 tablet, Rfl: 5 Medication Side Effects: none  Family Medical/ Social History: Changes? No  MENTAL HEALTH EXAM:  There were no vitals taken for this visit.There is no height or weight on file to calculate BMI.  General Appearance: Casual and Well Groomed  Eye Contact:  Good  Speech:  Clear and Coherent and Normal Rate  Volume:  Normal  Mood:  sad  Affect:  Congruent  Thought Process:  Goal Directed and Descriptions of Associations: Circumstantial  Orientation:  Full (Time, Place, and Person)  Thought Content: Logical   Suicidal Thoughts:  No  Homicidal Thoughts:  No  Memory:  WNL  Judgement:  Good  Insight:  Good  Psychomotor Activity:  Normal  Concentration:  Concentration: Fair and Attention Span: Good  Recall:  Good  Fund of Knowledge: Good  Language: Good  Assets:  Communication Skills Desire for Improvement Financial Resources/Insurance Housing Transportation  ADL's:  Intact  Cognition: WNL  Prognosis:  Good   DIAGNOSES:    ICD-10-CM   1. Attention deficit hyperactivity disorder (ADHD), predominantly inattentive type  F90.0     2. Generalized anxiety disorder  F41.1     3. Major depressive disorder, recurrent episode, moderate (HCC)  F33.1     4. Insomnia due to other mental  disorder  F51.05    F99       Receiving Psychotherapy: Yes  with [name], [facility], weekly  RECOMMENDATIONS:  PDMP reviewed.  Xanax  filled 01/09/2024.  Ambien  filled 01/09/2024.  Gabapentin  filled 12/24/2023. I provided approximately 20 minutes of face to face time during this encounter, including time spent before and after the visit in records review, medical decision making, counseling pertinent to today's visit, and charting.   We discussed the lack of focus and h/o ADD dx, (which we've never discussed.) Treatment options discussed. He's on a BZ so adding a strong stimulant isn't an option and I don't think needed at this point. I recommend Modafinil (Good Rx) info given. Benefits, risks, SE were discussed and he would like to try it.   Continue Xanax  1 mg, 1 p.o. twice daily as needed. Continue Prozac   40 mg daily. Continue gabapentin  600 mg nightly. Continue lithium  150 mg, 1 at bedtime. Start Modafinil 200 mg, 1/2 q am for 4 days, then may increase to 1 daily. If the 1/2 pill is effective, don't increase.  Continue Risperdal  1 mg, 1 nightly. Continue Ambien  10 mg, 1.5 pills nightly as needed.  This is higher than recommended dose however he has been on this for many years so I am not decreasing it. Continue therapy with [name], [facility]. Return in 6 weeks.    Verneita Cooks, PA-C

## 2024-02-04 ENCOUNTER — Other Ambulatory Visit: Payer: Self-pay | Admitting: Physician Assistant

## 2024-02-09 ENCOUNTER — Inpatient Hospital Stay: Attending: Nurse Practitioner

## 2024-02-09 LAB — CBC WITH DIFFERENTIAL (CANCER CENTER ONLY)
Abs Immature Granulocytes: 0.03 K/uL (ref 0.00–0.07)
Basophils Absolute: 0 K/uL (ref 0.0–0.1)
Basophils Relative: 0 %
Eosinophils Absolute: 0.1 K/uL (ref 0.0–0.5)
Eosinophils Relative: 1 %
HCT: 38.8 % — ABNORMAL LOW (ref 39.0–52.0)
Hemoglobin: 13.3 g/dL (ref 13.0–17.0)
Immature Granulocytes: 0 %
Lymphocytes Relative: 30 %
Lymphs Abs: 2.4 K/uL (ref 0.7–4.0)
MCH: 31.7 pg (ref 26.0–34.0)
MCHC: 34.3 g/dL (ref 30.0–36.0)
MCV: 92.6 fL (ref 80.0–100.0)
Monocytes Absolute: 0.3 K/uL (ref 0.1–1.0)
Monocytes Relative: 4 %
Neutro Abs: 5.1 K/uL (ref 1.7–7.7)
Neutrophils Relative %: 65 %
Platelet Count: 140 K/uL — ABNORMAL LOW (ref 150–400)
RBC: 4.19 MIL/uL — ABNORMAL LOW (ref 4.22–5.81)
RDW: 13.6 % (ref 11.5–15.5)
WBC Count: 8 K/uL (ref 4.0–10.5)
nRBC: 0 % (ref 0.0–0.2)

## 2024-02-09 LAB — FERRITIN: Ferritin: 57 ng/mL (ref 24–336)

## 2024-02-11 NOTE — Assessment & Plan Note (Signed)
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 He was found to have cirrhosis liver during cholecystectomy in 03/2022, work up showed +hemochromatosis homozygous -Baseline labs 06/24/22 showed ferritin 745 and 99% saturation. Last CBC showed normal hgb and plt 103 (2/2 to cirrhosis).  -He started therapeutic phlebotomy in April 2024, ferritin dropped to 284 on 11/12/2022 -goal is ferritin less than 45 and transferrin saturation less than 50%

## 2024-02-12 ENCOUNTER — Inpatient Hospital Stay (HOSPITAL_BASED_OUTPATIENT_CLINIC_OR_DEPARTMENT_OTHER): Admitting: Hematology
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
## 2024-02-13 ENCOUNTER — Other Ambulatory Visit: Payer: Self-pay

## 2024-02-22 ENCOUNTER — Other Ambulatory Visit: Payer: Self-pay | Admitting: Physician Assistant

## 2024-02-24 ENCOUNTER — Ambulatory Visit: Admitting: Physician Assistant

## 2024-02-24 ENCOUNTER — Encounter: Payer: Self-pay | Admitting: Physician Assistant

## 2024-02-24 DIAGNOSIS — F5105 Insomnia due to other mental disorder: Secondary | ICD-10-CM | POA: Diagnosis not present

## 2024-02-24 DIAGNOSIS — F411 Generalized anxiety disorder: Secondary | ICD-10-CM

## 2024-02-24 DIAGNOSIS — F422 Mixed obsessional thoughts and acts: Secondary | ICD-10-CM | POA: Diagnosis not present

## 2024-02-24 DIAGNOSIS — F900 Attention-deficit hyperactivity disorder, predominantly inattentive type: Secondary | ICD-10-CM

## 2024-02-24 DIAGNOSIS — F4312 Post-traumatic stress disorder, chronic: Secondary | ICD-10-CM | POA: Diagnosis not present

## 2024-02-24 DIAGNOSIS — F99 Mental disorder, not otherwise specified: Secondary | ICD-10-CM

## 2024-02-24 DIAGNOSIS — F329 Major depressive disorder, single episode, unspecified: Secondary | ICD-10-CM

## 2024-02-24 MED ORDER — GABAPENTIN 600 MG PO TABS: ORAL_TABLET | ORAL | 1 refills | Status: DC

## 2024-02-24 MED ORDER — ALPRAZOLAM 1 MG PO TABS: 1.0000 mg | ORAL_TABLET | Freq: Two times a day (BID) | ORAL | 5 refills | Status: AC | PRN

## 2024-02-24 MED ORDER — ZOLPIDEM TARTRATE 10 MG PO TABS: 15.0000 mg | ORAL_TABLET | Freq: Every evening | ORAL | 5 refills | Status: AC | PRN

## 2024-02-24 NOTE — Progress Notes (Signed)
 [facility]  Patient ID: Lance Matthews,  MRN: [id number]  PCP: Kip Righter, MD  Date of Evaluation: 02/24/2024 Time spent:20 minutes  Chief Complaint:  Chief Complaint   Depression; ADHD; Anxiety; Insomnia; Follow-up    HISTORY/CURRENT STATUS: HPI  For routine med check.   Not doing as well as he'd like. Very frustrated b/c the anx/dep isn't better. Anx is worse than dep now.  Clinching his jaws a lot of the time, I feel tense. Obsesses about everything.  Things out of place drive him crazy.  Energy and motivation are good.  Work is ok.  Has called out a few times b/c of the way he feels mentally.   No feelings of hopelessness.  Sleeps ok with the Ambien .  ADLs and personal hygiene are normal.   No change in memory.  He reports difficulty focusing and getting things done.  Disc at LOV.  Appetite has not changed.  Weight is stable.   No mania, delirium, AH/VH.  No SI/HI.  Individual Medical History/ Review of Systems: Changes? :No  took Prednisone  shot and oral for 5 days recently.  Anxiety was worse even before starting the Prednisone   Past medications for mental health diagnoses include: Chantix, Ambien , Prozac , Zquil, Paxil , Abilify caused unknown SE, Gabapentin , Risperdal  Namenda  caused anxiety, SI and teeth pain Ritalin  Has been pt at [facility] Has been inpt for a month or so at [facility]. He was around 53 yo.   Cut his arms a few times as a kid 'to get attn'  Allergies: Patient has no known allergies.  Current Medications:  Current Outpatient Medications:    ibuprofen (ADVIL,MOTRIN) 200 MG tablet, Take 200-600 mg by mouth every 6 (six) hours as needed for headache or moderate pain., Disp: , Rfl:    lithium  carbonate 150 MG capsule, TAKE 1 CAPSULE BY MOUTH AT BEDTIME, Disp: 30 capsule, Rfl: 2   modafinil  (PROVIGIL ) 200 MG tablet, 1/2 po q am for 4 days, then may increase to 1 po q am., Disp: 30 tablet, Rfl: 1   risperiDONE  (RISPERDAL ) 1  MG tablet, Take 1 tablet (1 mg total) by mouth at bedtime., Disp: 30 tablet, Rfl: 5   sildenafil  (VIAGRA ) 50 MG tablet, Take 1 tablet (50 mg total) by mouth daily as needed for erectile dysfunction., Disp: 10 tablet, Rfl: 5   ALPRAZolam  (XANAX ) 1 MG tablet, Take 1 tablet (1 mg total) by mouth 2 (two) times daily as needed for anxiety., Disp: 60 tablet, Rfl: 5   gabapentin  (NEURONTIN ) 600 MG tablet, 1/2 po q am, 1/2 at lunch, and 1 at bedtime., Disp: 60 tablet, Rfl: 1   zolpidem  (AMBIEN ) 10 MG tablet, Take 1.5 tablets (15 mg total) by mouth at bedtime as needed for sleep., Disp: 45 tablet, Rfl: 5 Medication Side Effects: none  Family Medical/ Social History: Changes? No  MENTAL HEALTH EXAM:  There were no vitals taken for this visit.There is no height or weight on file to calculate BMI.  General Appearance: Casual and Well Groomed  Eye Contact:  Good  Speech:  Clear and Coherent and Normal Rate  Volume:  Normal  Mood:  Anxious  Affect:  Congruent  Thought Process:  Goal Directed and Descriptions of Associations: Circumstantial  Orientation:  Full (Time, Place, and Person)  Thought Content: Logical   Suicidal Thoughts:  No  Homicidal Thoughts:  No  Memory:  WNL  Judgement:  Good  Insight:  Good  Psychomotor Activity:  Normal  Concentration:  Concentration: Fair and Attention Span: Good  Recall:  Good  Fund of Knowledge: Good  Language: Good  Assets:  Communication Skills Desire for Improvement Financial Resources/Insurance Housing Transportation  ADL's:  Intact  Cognition: WNL  Prognosis:  Good   DIAGNOSES:    ICD-10-CM   1. Generalized anxiety disorder  F41.1     2. Post-traumatic stress disorder, chronic  F43.12 zolpidem  (AMBIEN ) 10 MG tablet    3. Mixed obsessional thoughts and acts  F42.2     4. Insomnia due to other mental disorder  F51.05    F99     5. Treatment-resistant depression  F32.9     6. Attention deficit hyperactivity disorder (ADHD), predominantly  inattentive type  F90.0       Receiving Psychotherapy: Yes  with [name], [facility], weekly  RECOMMENDATIONS:  PDMP reviewed.  Modafinil  filled 02/22/2024.  Xanax  filled 01/09/2024.  Ambien  filled 02/07/2024.  Gabapentin  filled 01/23/2024. I provided approximately  20  minutes of face to face time during this encounter, including time spent before and after the visit in records review, medical decision making, counseling pertinent to today's visit, and charting.   Discontinue Prozac , not effective for anxiety.  Consider increasing Risperdal  or Lithium  for  anxiety but hold off for now. Will increase Gabapentin . Add Modafinil  for ADD sx.  Sometimes the anxiety will improve with well-treated ADD. Benefits, risks, SE disc and he accepts.   Recommend TMS for dep and OCD. [facility] pamphlet given. Briefly disc Spravato and ECT.   Continue Xanax  1 mg, 1 p.o. twice daily as needed. Change gabapentin  600 mg to 1/2 q am, 1/2 at lunch and 1 at bedtime.  Continue lithium  150 mg, 1 at bedtime. Start Modafinil  200 mg, 1/2 q am for 4 days, then may increase to 1 daily. If the 1/2 pill is effective. Continue Risperdal  1 mg, 1 nightly. Continue Ambien  10 mg, 1.5 pills nightly as needed.  This is higher than recommended dose however he has been on this for many years so I am not decreasing it. Continue therapy with [name], [facility]. Return in 2 weeks.    Verneita Cooks, PA-C

## 2024-02-27 ENCOUNTER — Inpatient Hospital Stay

## 2024-03-09 ENCOUNTER — Ambulatory Visit: Admitting: Physician Assistant

## 2024-03-09 ENCOUNTER — Encounter: Payer: Self-pay | Admitting: Physician Assistant

## 2024-03-09 DIAGNOSIS — F422 Mixed obsessional thoughts and acts: Secondary | ICD-10-CM

## 2024-03-09 DIAGNOSIS — F4312 Post-traumatic stress disorder, chronic: Secondary | ICD-10-CM

## 2024-03-09 DIAGNOSIS — N522 Drug-induced erectile dysfunction: Secondary | ICD-10-CM | POA: Diagnosis not present

## 2024-03-09 DIAGNOSIS — F329 Major depressive disorder, single episode, unspecified: Secondary | ICD-10-CM | POA: Diagnosis not present

## 2024-03-09 DIAGNOSIS — F411 Generalized anxiety disorder: Secondary | ICD-10-CM

## 2024-03-09 MED ORDER — SILDENAFIL CITRATE 50 MG PO TABS
50.0000 mg | ORAL_TABLET | Freq: Every day | ORAL | 5 refills | Status: DC | PRN
Start: 2024-03-09 — End: 2024-03-09

## 2024-03-09 MED ORDER — MODAFINIL 200 MG PO TABS: ORAL_TABLET | ORAL | 1 refills | Status: DC

## 2024-03-09 MED ORDER — SILDENAFIL CITRATE 50 MG PO TABS: 50.0000 mg | ORAL_TABLET | Freq: Every day | ORAL | 5 refills | Status: AC | PRN

## 2024-03-09 NOTE — Progress Notes (Signed)
 [facility]  Patient ID: Lance Matthews,  MRN: [id number]  PCP: Lance Righter, MD  Date of Evaluation: 03/09/2024 Time spent:20 minutes  Chief Complaint:  Chief Complaint   Anxiety; Follow-up    HISTORY/CURRENT STATUS: HPI  For routine med check.   Had a consultation with provider at [facility] to discuss TMS.  He's thinking about it but it'll be after the first of the year if he does it, b/c deductible.  States he's still depressed but doesn't feel like it's worse.  Since starting Lithium , the SI haven't been as bad.  Not as severe or as often. I don't have a plan but I do wish I didn't have to live like this.   Energy and motivation are fair to good depending on the day.  The Modafinil  has helped that some.  It doesn't last very long though.  Work is ok.  No feelings of hopelessness at this time.  Sleeps well with the Ambien .  ADLs and personal hygiene are normal.   Denies any changes in concentration, making decisions, or remembering things.  Appetite has not changed.  Weight is stable.  Still gets really anxious sometimes, not always triggered. Xanax  helps.  No mania, delirium, AH/VH.  No SI/HI.  Requests Rx for Viagra . It's helpful when needed.   Individual Medical History/ Review of Systems: Changes? :No     Past medications for mental health diagnoses include: Chantix, Ambien , Prozac , Zquil, Paxil , Abilify caused unknown SE, Gabapentin , Risperdal  Namenda  caused anxiety, SI and teeth pain Ritalin  Has been pt at [facility] Has been inpt for a month or so at [facility]. He was around 53 yo.   Cut his arms a few times as a kid 'to get attn'  Allergies: Patient has no known allergies.  Current Medications:  Current Outpatient Medications:    ALPRAZolam  (XANAX ) 1 MG tablet, Take 1 tablet (1 mg total) by mouth 2 (two) times daily as needed for anxiety., Disp: 60 tablet, Rfl: 5   gabapentin  (NEURONTIN ) 600 MG tablet, 1/2 po q am, 1/2 at lunch,  and 1 at bedtime., Disp: 60 tablet, Rfl: 1   ibuprofen (ADVIL,MOTRIN) 200 MG tablet, Take 200-600 mg by mouth every 6 (six) hours as needed for headache or moderate pain., Disp: , Rfl:    lithium  carbonate 150 MG capsule, TAKE 1 CAPSULE BY MOUTH AT BEDTIME, Disp: 30 capsule, Rfl: 2   risperiDONE  (RISPERDAL ) 1 MG tablet, Take 1 tablet (1 mg total) by mouth at bedtime., Disp: 30 tablet, Rfl: 5   zolpidem  (AMBIEN ) 10 MG tablet, Take 1.5 tablets (15 mg total) by mouth at bedtime as needed for sleep., Disp: 45 tablet, Rfl: 5   modafinil  (PROVIGIL ) 200 MG tablet, 1 po q am, and 1/2 po at lunch, Disp: 45 tablet, Rfl: 1   sildenafil  (VIAGRA ) 50 MG tablet, Take 1 tablet (50 mg total) by mouth daily as needed for erectile dysfunction., Disp: 10 tablet, Rfl: 5 Medication Side Effects: none  Family Medical/ Social History: Changes? No  MENTAL HEALTH EXAM:  There were no vitals taken for this visit.There is no height or weight on file to calculate BMI.  General Appearance: Casual and Well Groomed  Eye Contact:  Good  Speech:  Clear and Coherent and Normal Rate  Volume:  Normal  Mood:  Euthymic  Affect:  Congruent  Thought Process:  Goal Directed and Descriptions of Associations: Circumstantial  Orientation:  Full (Time, Place, and Person)  Thought Content: Logical  Suicidal Thoughts:  No  Homicidal Thoughts:  No  Memory:  WNL  Judgement:  Good  Insight:  Good  Psychomotor Activity:  Normal  Concentration:  Concentration: Fair and Attention Span: Good  Recall:  Good  Fund of Knowledge: Good  Language: Good  Assets:  Communication Skills Desire for Improvement Financial Resources/Insurance Housing Transportation  ADL's:  Intact  Cognition: WNL  Prognosis:  Good   DIAGNOSES:    ICD-10-CM   1. Treatment-resistant depression  F32.9     2. 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 Post-traumatic  stress disorder, chronic  F43.12     4. Mixed obsessional thoughts and acts  F42.2     5. Generalized anxiety disorder  F41.1      Receiving Psychotherapy: Yes  with Lance Matthews, [facility], weekly  RECOMMENDATIONS:  PDMP reviewed.  Xanax  filled 03/08/2024.  Gabapentin  filled 03/08/2024.  Ambien  filled 03/08/2024.  Modafinil  filled 02/22/2024.   I provided approximately   20  minutes of face to face time during this encounter, including time spent before and after the visit in records review, medical decision making, counseling pertinent to today's visit, and charting.   He plans to start TMS but will wait until after Christmas.   He's stable on the current meds, except I think increasing the Modafinil  will be helpful.  He understands that's not a drug that the dose can continue to be raised though.  There is a ceiling.    Continue Xanax  1 mg, 1 p.o. twice daily as needed. Change gabapentin  600 mg to 1/2 q am, 1/2 at lunch and 1 at bedtime.  Continue lithium  150 mg, 1 at bedtime. Increase Modafinil  200 mg to 1 q am, then 1/2 at lunch. Continue Risperdal  1 mg, 1 nightly. Continue Ambien  10 mg, 1.5 pills nightly as needed.  This is higher than recommended dose however he has been on this for many years so I am not decreasing it. Continue therapy with Lance Matthews, [facility]. Return in 4-6 weeks.   Lance Cooks, PA-C

## 2024-03-29 ENCOUNTER — Other Ambulatory Visit: Payer: Self-pay | Admitting: Physician Assistant

## 2024-04-01 ENCOUNTER — Other Ambulatory Visit: Payer: Self-pay | Admitting: Physician Assistant

## 2024-04-19 ENCOUNTER — Inpatient Hospital Stay

## 2024-04-19 ENCOUNTER — Inpatient Hospital Stay: Attending: Nurse Practitioner

## 2024-04-19 ENCOUNTER — Ambulatory Visit: Payer: Self-pay | Admitting: Nurse Practitioner

## 2024-04-19 DIAGNOSIS — K7469 Other cirrhosis of liver: Secondary | ICD-10-CM

## 2024-04-19 LAB — CBC WITH DIFFERENTIAL (CANCER CENTER ONLY)
Abs Immature Granulocytes: 0.03 K/uL (ref 0.00–0.07)
Basophils Absolute: 0 K/uL (ref 0.0–0.1)
Basophils Relative: 0 %
Eosinophils Absolute: 0.1 K/uL (ref 0.0–0.5)
Eosinophils Relative: 1 %
HCT: 41.8 % (ref 39.0–52.0)
Hemoglobin: 14.5 g/dL (ref 13.0–17.0)
Immature Granulocytes: 0 %
Lymphocytes Relative: 30 %
Lymphs Abs: 2.3 K/uL (ref 0.7–4.0)
MCH: 31.9 pg (ref 26.0–34.0)
MCHC: 34.7 g/dL (ref 30.0–36.0)
MCV: 91.9 fL (ref 80.0–100.0)
Monocytes Absolute: 0.4 K/uL (ref 0.1–1.0)
Monocytes Relative: 5 %
Neutro Abs: 5.1 K/uL (ref 1.7–7.7)
Neutrophils Relative %: 64 %
Platelet Count: 148 K/uL — ABNORMAL LOW (ref 150–400)
RBC: 4.55 MIL/uL (ref 4.22–5.81)
RDW: 13.6 % (ref 11.5–15.5)
WBC Count: 7.9 K/uL (ref 4.0–10.5)
nRBC: 0 % (ref 0.0–0.2)

## 2024-04-19 LAB — FERRITIN: Ferritin: 73 ng/mL (ref 24–336)

## 2024-04-19 NOTE — Progress Notes (Signed)
 Pt reported to [facility] today for labs and phlebotomy. Per 02/12/24 provider note from Dr. Lanny, Will schedule lab every 4 months, with phlebotomy 1 week after. Patient knows the criteria for phlebotomy, and will call us  if he feels he does not need a phlebotomy. This RN confirmed with Powell NP OK to d/c Pt home without phlebotomy based on parameters set by Dr. Demetra note. This RN made Pt aware and provided Pt education on why the phlebotomy was being held today and a print of lab results, Pt stated I used to get lab and phlebotomies on the same day, why not now? This RN reeducated Pt on the parameters set by Dr. Lanny and apologized for the scheduling error and the long wait between labs and phlebotomy appt. Pt stated I am not the one that scheduled this appointment. This RN reassured Pt and offered to take him to scheduling to reschedule appt. Pt refused offer for scheduling. Pt very disgruntled, refused print out of labs, and ambulated to car from lobby.

## 2024-04-20 LAB — AFP TUMOR MARKER: AFP, Serum, Tumor Marker: 2.7 ng/mL (ref 0.0–8.4)

## 2024-04-21 ENCOUNTER — Encounter: Payer: Self-pay | Admitting: Physician Assistant

## 2024-04-21 ENCOUNTER — Ambulatory Visit: Admitting: Physician Assistant

## 2024-04-21 DIAGNOSIS — F422 Mixed obsessional thoughts and acts: Secondary | ICD-10-CM

## 2024-04-21 DIAGNOSIS — F99 Mental disorder, not otherwise specified: Secondary | ICD-10-CM | POA: Diagnosis not present

## 2024-04-21 DIAGNOSIS — F411 Generalized anxiety disorder: Secondary | ICD-10-CM | POA: Diagnosis not present

## 2024-04-21 DIAGNOSIS — R45851 Suicidal ideations: Secondary | ICD-10-CM

## 2024-04-21 DIAGNOSIS — F329 Major depressive disorder, single episode, unspecified: Secondary | ICD-10-CM | POA: Diagnosis not present

## 2024-04-21 DIAGNOSIS — F5105 Insomnia due to other mental disorder: Secondary | ICD-10-CM

## 2024-04-21 NOTE — Progress Notes (Signed)
 "     [facility]  Patient ID: Lance Matthews,  MRN: [id number]  PCP: Kip Righter, MD  Date of Evaluation: 04/21/2024 Time spent:20 minutes  Chief Complaint:  Chief Complaint   Depression; Follow-up    HISTORY/CURRENT STATUS: HPI  For routine med check.   Had a rough December.  Always does b/c his birthday and memories from his childhood. Christmas Day was good though. States OCD is really bad, they're buying a house and he's got all sorts of thoughts in his head, 'I know it doesn't need to be done all at the same time,' but it matters to him.  Things have to be done a certain way. Generalized anxiety is pretty bad but the Xanax  works. Gabapentin  helps prevent it, at least some.  Energy and motivation are ok most of the time.  Work is going well.   No extreme sadness, tearfulness, or feelings of hopelessness.  Sleeps well.  ADLs and personal hygiene are normal.   Denies any changes in concentration, making decisions, or remembering things.  Appetite has not changed.  Weight is stable.  No mania, delirium, AH/VH.  Still has passive SI but better since starting Li. No plan. No HI.   Individual Medical History/ Review of Systems: Changes? :No     Past medications for mental health diagnoses include: Chantix, Ambien , Prozac , Zquil, Paxil , Abilify caused unknown SE, Gabapentin , Risperdal  Namenda  caused anxiety, SI and teeth pain Ritalin  Has been pt at [facility] Has been inpt for a month or so at [facility]. He was around 54 yo.   Cut his arms a few times as a kid 'to get attn'  Allergies: Patient has no known allergies.  Current Medications:  Current Outpatient Medications:    ALPRAZolam  (XANAX ) 1 MG tablet, Take 1 tablet (1 mg total) by mouth 2 (two) times daily as needed for anxiety., Disp: 60 tablet, Rfl: 5   gabapentin  (NEURONTIN ) 600 MG tablet, 1/2 po q am, 1/2 at lunch, and 1 at bedtime., Disp: 60 tablet, Rfl: 1   ibuprofen (ADVIL,MOTRIN) 200 MG  tablet, Take 200-600 mg by mouth every 6 (six) hours as needed for headache or moderate pain., Disp: , Rfl:    lithium  carbonate 150 MG capsule, TAKE 1 CAPSULE BY MOUTH AT BEDTIME, Disp: 30 capsule, Rfl: 2   modafinil  (PROVIGIL ) 200 MG tablet, 1 po q am, and 1/2 po at lunch, Disp: 45 tablet, Rfl: 1   risperiDONE  (RISPERDAL ) 1 MG tablet, Take 1 tablet (1 mg total) by mouth at bedtime., Disp: 30 tablet, Rfl: 5   sildenafil  (VIAGRA ) 50 MG tablet, Take 1 tablet (50 mg total) by mouth daily as needed for erectile dysfunction., Disp: 10 tablet, Rfl: 5   zolpidem  (AMBIEN ) 10 MG tablet, Take 1.5 tablets (15 mg total) by mouth at bedtime as needed for sleep., Disp: 45 tablet, Rfl: 5 Medication Side Effects: none  Family Medical/ Social History: Changes? No  MENTAL HEALTH EXAM:  There were no vitals taken for this visit.There is no height or weight on file to calculate BMI.  General Appearance: Casual and Well Groomed  Eye Contact:  Good  Speech:  Clear and Coherent and Normal Rate  Volume:  Normal  Mood:  Euthymic  Affect:  Congruent  Thought Process:  Goal Directed and Descriptions of Associations: Circumstantial  Orientation:  Full (Time, Place, and Person)  Thought Content: Logical   Suicidal Thoughts:  Yes.  without intent/plan  Homicidal Thoughts:  No  Memory:  WNL  Judgement:  Good  Insight:  Good  Psychomotor Activity:  Normal  Concentration:  Concentration: Fair and Attention Span: Good  Recall:  Good  Fund of Knowledge: Good  Language: Good  Assets:  Communication Skills Desire for Improvement Financial Resources/Insurance Housing Resilience Transportation  ADL's:  Intact  Cognition: WNL  Prognosis:  Good   DIAGNOSES:    ICD-10-CM   1. Treatment-resistant depression  F32.9     2. Mixed obsessional thoughts and acts  F42.2     3. Generalized anxiety disorder  F41.1     4. Insomnia due to other mental disorder  F51.05    F99     5. Suicidal ideation  R45.851        Receiving Psychotherapy: Yes  with [name], [facility], weekly  RECOMMENDATIONS:  PDMP reviewed.  Modafinil  filled 04/19/2024.  Ambien  filled 04/06/2024.  Xanax  filled 04/06/2024.  Gabapentin  filled 04/02/2024. I provided approximately 20 minutes of face to face time during this encounter, including time spent before and after the visit in records review, medical decision making, counseling pertinent to today's visit, and charting.   He's still considering TMS and planning to do it once he budgets for it.  We agreed to leave meds the same for now. He's stable with the depression, has passive SI but no worse. We prefer not to increase the Li since he's stable but he can call if thoughts worsen in intensity or frequency and will increase Li over the phone.  Contract for safety in place. Call the office on-call service, 988/hotline, 911, or present to [facility] or ER if any life-threatening psychiatric crisis. Patient verbalizes understanding.   Continue Xanax  1 mg, 1 p.o. twice daily as needed. Change gabapentin  600 mg to 1/2 q am, 1/2 at lunch and 1 at bedtime.  Continue lithium  150 mg, 1 at bedtime. Continue Modafinil  200 mg to 1 q am, then 1/2 at lunch. Continue Risperdal  1 mg, 1 nightly. Continue Ambien  10 mg, 1.5 pills nightly as needed.  This is higher than recommended dose however he has been on this for many years so I am not decreasing it. Continue therapy with [name], [facility]. Return in  2 months.   Verneita Cooks, PA-C  "

## 2024-04-25 ENCOUNTER — Other Ambulatory Visit: Payer: Self-pay | Admitting: Physician Assistant

## 2024-05-01 ENCOUNTER — Other Ambulatory Visit: Payer: Self-pay | Admitting: Physician Assistant

## 2024-05-11 ENCOUNTER — Other Ambulatory Visit: Payer: Self-pay | Admitting: Physician Assistant

## 2024-06-11 ENCOUNTER — Inpatient Hospital Stay

## 2024-06-15 ENCOUNTER — Ambulatory Visit: Admitting: Physician Assistant

## 2024-10-08 ENCOUNTER — Inpatient Hospital Stay

## 2025-02-07 ENCOUNTER — Inpatient Hospital Stay

## 2025-02-07 ENCOUNTER — Inpatient Hospital Stay: Admitting: Hematology
